# Patient Record
Sex: Female | Born: 1935 | Race: White | Hispanic: No | Smoking: Never smoker
Health system: Southern US, Community
[De-identification: ages and names within clinical notes are randomized; demographics above are authoritative.]

## PROBLEM LIST (undated history)

## (undated) DIAGNOSIS — J189 Pneumonia, unspecified organism: Secondary | ICD-10-CM

## (undated) DIAGNOSIS — E079 Disorder of thyroid, unspecified: Secondary | ICD-10-CM

## (undated) DIAGNOSIS — M722 Plantar fascial fibromatosis: Secondary | ICD-10-CM

## (undated) DIAGNOSIS — C801 Malignant (primary) neoplasm, unspecified: Secondary | ICD-10-CM

## (undated) DIAGNOSIS — E039 Hypothyroidism, unspecified: Secondary | ICD-10-CM

## (undated) DIAGNOSIS — H409 Unspecified glaucoma: Secondary | ICD-10-CM

## (undated) HISTORY — PX: APPENDECTOMY: SHX54

## (undated) HISTORY — PX: ABDOMINAL HYSTERECTOMY: SHX81

---
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
## 1999-10-14 ENCOUNTER — Encounter: Payer: Self-pay | Admitting: Family Medicine

## 1999-10-14 ENCOUNTER — Encounter: Admission: RE | Admit: 1999-10-14 | Discharge: 1999-10-14 | Payer: Self-pay | Admitting: Family Medicine

## 1999-10-17 ENCOUNTER — Encounter: Payer: Self-pay | Admitting: Family Medicine

## 1999-10-17 ENCOUNTER — Encounter: Admission: RE | Admit: 1999-10-17 | Discharge: 1999-10-17 | Payer: Self-pay | Admitting: Family Medicine

## 2001-01-07 ENCOUNTER — Encounter (INDEPENDENT_AMBULATORY_CARE_PROVIDER_SITE_OTHER): Payer: Self-pay | Admitting: Specialist

## 2001-01-07 ENCOUNTER — Other Ambulatory Visit: Admission: RE | Admit: 2001-01-07 | Discharge: 2001-01-07 | Payer: Self-pay | Admitting: Internal Medicine

## 2001-10-24 ENCOUNTER — Emergency Department (HOSPITAL_COMMUNITY): Admission: EM | Admit: 2001-10-24 | Discharge: 2001-10-24 | Payer: Self-pay

## 2003-08-02 ENCOUNTER — Other Ambulatory Visit: Admission: RE | Admit: 2003-08-02 | Discharge: 2003-08-02 | Payer: Self-pay | Admitting: Obstetrics and Gynecology

## 2003-09-26 ENCOUNTER — Encounter: Payer: Self-pay | Admitting: Cardiology

## 2003-09-26 ENCOUNTER — Ambulatory Visit (HOSPITAL_COMMUNITY): Admission: RE | Admit: 2003-09-26 | Discharge: 2003-09-26 | Payer: Self-pay | Admitting: Internal Medicine

## 2003-09-30 ENCOUNTER — Emergency Department (HOSPITAL_COMMUNITY): Admission: AD | Admit: 2003-09-30 | Discharge: 2003-09-30 | Payer: Self-pay | Admitting: Family Medicine

## 2004-11-28 ENCOUNTER — Emergency Department (HOSPITAL_COMMUNITY): Admission: EM | Admit: 2004-11-28 | Discharge: 2004-11-29 | Payer: Self-pay | Admitting: Emergency Medicine

## 2005-06-06 ENCOUNTER — Emergency Department (HOSPITAL_COMMUNITY): Admission: EM | Admit: 2005-06-06 | Discharge: 2005-06-06 | Payer: Self-pay | Admitting: Family Medicine

## 2007-04-24 ENCOUNTER — Emergency Department (HOSPITAL_COMMUNITY): Admission: EM | Admit: 2007-04-24 | Discharge: 2007-04-24 | Payer: Self-pay | Admitting: Family Medicine

## 2007-05-11 ENCOUNTER — Ambulatory Visit: Payer: Self-pay | Admitting: Internal Medicine

## 2007-10-14 DIAGNOSIS — IMO0001 Reserved for inherently not codable concepts without codable children: Secondary | ICD-10-CM | POA: Insufficient documentation

## 2007-10-14 DIAGNOSIS — M1990 Unspecified osteoarthritis, unspecified site: Secondary | ICD-10-CM | POA: Insufficient documentation

## 2007-10-14 DIAGNOSIS — Z8711 Personal history of peptic ulcer disease: Secondary | ICD-10-CM

## 2007-10-14 DIAGNOSIS — M2550 Pain in unspecified joint: Secondary | ICD-10-CM | POA: Insufficient documentation

## 2007-10-14 DIAGNOSIS — K219 Gastro-esophageal reflux disease without esophagitis: Secondary | ICD-10-CM | POA: Insufficient documentation

## 2007-10-14 DIAGNOSIS — K6389 Other specified diseases of intestine: Secondary | ICD-10-CM

## 2008-02-01 ENCOUNTER — Encounter: Admission: RE | Admit: 2008-02-01 | Discharge: 2008-02-28 | Payer: Self-pay | Admitting: Podiatry

## 2008-07-08 ENCOUNTER — Emergency Department (HOSPITAL_COMMUNITY): Admission: EM | Admit: 2008-07-08 | Discharge: 2008-07-08 | Payer: Self-pay | Admitting: Emergency Medicine

## 2009-03-07 ENCOUNTER — Ambulatory Visit: Payer: Self-pay | Admitting: Obstetrics and Gynecology

## 2009-03-07 ENCOUNTER — Encounter: Payer: Self-pay | Admitting: Obstetrics and Gynecology

## 2009-03-07 ENCOUNTER — Other Ambulatory Visit: Admission: RE | Admit: 2009-03-07 | Discharge: 2009-03-07 | Payer: Self-pay | Admitting: Obstetrics and Gynecology

## 2009-04-24 ENCOUNTER — Ambulatory Visit: Payer: Self-pay | Admitting: Sports Medicine

## 2009-04-24 DIAGNOSIS — Q667 Congenital pes cavus, unspecified foot: Secondary | ICD-10-CM

## 2009-04-24 DIAGNOSIS — R269 Unspecified abnormalities of gait and mobility: Secondary | ICD-10-CM

## 2009-05-22 ENCOUNTER — Ambulatory Visit: Payer: Self-pay | Admitting: Sports Medicine

## 2009-06-19 ENCOUNTER — Ambulatory Visit: Payer: Self-pay | Admitting: Sports Medicine

## 2009-06-19 DIAGNOSIS — M722 Plantar fascial fibromatosis: Secondary | ICD-10-CM

## 2009-11-22 ENCOUNTER — Ambulatory Visit: Payer: Self-pay | Admitting: Sports Medicine

## 2010-09-03 NOTE — Assessment & Plan Note (Signed)
Summary: FOOT PAIN/MJD   Vital Signs:  Patient profile:   76 year old female Height:      64 inches Weight:      186 pounds BP sitting:   137 / 72  Vitals Entered By: Lillia Pauls CMA (November 22, 2009 9:35 AM)  History of Present Illness: 76 yo F here for f/u plantar fasciitis  Was doing very well up until 2 weeks ago Issues only with right side currently Thinks this started when she increased from 20 reps of eccentrics to 30 reps. Pain at base of right heel and back of right heel (not at achilles though). Arch strap made this worse (metatarsalgia/mortons neuroma). Is icing, taking tylenol. Has insoles with scaphoid pads previously - now with hard arch support with spenco soft insole. Wears Z-coil shoes. Has not had an injection.   Physical Exam  General:  alert, well-developed, and well-nourished.   Msk:  Foot/Ankle: RIGHT No visible erythema or swelling. Small bunion. Range of motion is full in all directions. Normal 1st toe movement. Strength is 5/5 in all directions. Stable lateral and medial ligaments; squeeze test unremarkable; Mod TTP anterior calcaneus at plantar fascia insertion.  Also TTP posterior plantar heel.  Talar dome nontender; No pain at base of 5th MT; No tenderness over cuboid; No tenderness over N spot or navicular prominence No tenderness on posterior aspects of lateral and medial malleolus No sign of peroneal tendon subluxations; Able to walk 4 steps. Pes cavus. 2nd toes recruited by first toes. Hindfoot valgus.   Impression & Recommendations:  Problem # 1:  PLANTAR FASCIITIS, BILATERAL (ICD-728.71) Assessment Deteriorated Patient declined custom orthotics today stating the bases feel like they would be too hard.  Discussed cushioning we would place on bottoms of these and offered red cambray but still declined.  Continue icing, nsaids/tylenol as needed, decrease amount of eccentric exercise she is doing (this likely precipitated increase in  symptoms).  No arch strap as this is more painful.  No injection today - most of pain more posterior in heel than anterior portion of calcaneus - shot would likely provide little benefit.  Given rx for custom orthotics to [facility] to see if they have a kind she would find more comfortable.  F/u as needed.  Complete Medication List: 1)  Orthotics  Prescriptions: ORTHOTICS   #1 x 0   Entered by:   Lillia Pauls CMA   Authorized by:   Norton Blizzard MD   Signed by:   Lillia Pauls CMA on 11/22/2009   Method used:   Print then Give to Patient   RxID:   [id number] ORTHOTICS   #0 x 0   Entered by:   Lillia Pauls CMA   Authorized by:   Enid Baas MD   Signed by:   Lillia Pauls CMA on 11/22/2009   Method used:   Print then Give to Patient   RxID:   [id number]
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
## 2012-12-29 ENCOUNTER — Other Ambulatory Visit: Payer: Self-pay | Admitting: Family Medicine

## 2012-12-29 DIAGNOSIS — M549 Dorsalgia, unspecified: Secondary | ICD-10-CM

## 2013-01-04 ENCOUNTER — Ambulatory Visit
Admission: RE | Admit: 2013-01-04 | Discharge: 2013-01-04 | Disposition: A | Discharge status: Home / Self Care | Payer: Medicare Other | Source: Ambulatory Visit | Attending: Family Medicine | Admitting: Family Medicine

## 2013-01-04 DIAGNOSIS — M549 Dorsalgia, unspecified: Secondary | ICD-10-CM

## 2013-07-25 ENCOUNTER — Ambulatory Visit (INDEPENDENT_AMBULATORY_CARE_PROVIDER_SITE_OTHER): Payer: Medicare Other | Admitting: Sports Medicine

## 2013-07-25 ENCOUNTER — Encounter: Payer: Self-pay | Admitting: Sports Medicine

## 2013-07-25 VITALS — BP 114/65 | Ht 64.5 in | Wt 187.0 lb

## 2013-07-25 DIAGNOSIS — M545 Low back pain: Secondary | ICD-10-CM

## 2013-07-25 MED ORDER — TRAMADOL HCL 50 MG PO TBDP
50.0000 mg | ORAL_TABLET | Freq: Two times a day (BID) | ORAL | Status: DC | PRN
Start: 2013-07-25 — End: 2013-11-07
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
## 2013-07-25 NOTE — Patient Instructions (Addendum)
Continue pool exercises and stretching  On good days try to incorporate more walkin  Try taking tramadol twice daily as needed for pain  Please follow up in 3 months  Thank you for seeing [place] today!

## 2013-07-25 NOTE — Assessment & Plan Note (Signed)
She is strong and has good hip ROM  Advised that I think this is significant back issue with nerve root impingement  Trial on Tramadol bid  Keep up water exercise  Does not like to take medication but most issues are not ideal for rehab so feel we must push some medicine

## 2013-08-02 ENCOUNTER — Telehealth: Payer: Self-pay | Admitting: *Deleted

## 2013-08-02 MED ORDER — LIDOCAINE 5 % EX PTCH: 1.0000 | MEDICATED_PATCH | Freq: Every day | CUTANEOUS | Status: DC | PRN

## 2013-08-02 NOTE — Telephone Encounter (Signed)
Message copied by Mora Bellman on Tue Aug 02, 2013  9:13 AM ------      Message from: Enid Baas      Created: Mon Aug 01, 2013  8:04 PM      Regarding: FW: Lidocaine Patch instead of Tramadol      Contact: [phone]       She is welcome to try lidoderm patch although I do not know that she can get it approved since her problem is low back pain with probable radicular sxs.  I don't think it will be strong enough to help her pain.            Instructions are no more than 12 hours continuous.  Avoid skin, eye ittitation after handling.            KBF      ----- Message -----         From: Claiborne Billings         Sent: 08/01/2013   4:36 PM           To: Enid Baas, MD, Mora Bellman, RN      Subject: Lidocaine Patch instead of Tramadol                      Prefer not to take Tramadol.  Would like to get the Lidocaine patch instead.       ------

## 2013-08-10 ENCOUNTER — Encounter: Payer: Self-pay | Admitting: Internal Medicine

## 2013-09-09 ENCOUNTER — Other Ambulatory Visit: Payer: Self-pay | Admitting: Gastroenterology

## 2013-09-09 DIAGNOSIS — R1013 Epigastric pain: Secondary | ICD-10-CM

## 2013-09-26 ENCOUNTER — Ambulatory Visit (HOSPITAL_COMMUNITY)
Admission: RE | Admit: 2013-09-26 | Discharge: 2013-09-26 | Disposition: A | Discharge status: Home / Self Care | Payer: Medicare Other | Source: Ambulatory Visit | Attending: Gastroenterology | Admitting: Gastroenterology

## 2013-09-26 DIAGNOSIS — R1013 Epigastric pain: Secondary | ICD-10-CM

## 2013-09-26 MED ORDER — SINCALIDE 5 MCG IJ SOLR
0.0200 ug/kg | Freq: Once | INTRAMUSCULAR | Status: AC
  Administered 2013-09-26: 1.7 ug via INTRAVENOUS

## 2013-09-26 MED ORDER — TECHNETIUM TC 99M MEBROFENIN IV KIT
5.0000 | PACK | Freq: Once | INTRAVENOUS | Status: AC | PRN
  Administered 2013-09-26: 5 via INTRAVENOUS

## 2013-09-26 MED ORDER — SINCALIDE 5 MCG IJ SOLR
INTRAMUSCULAR | Status: AC
  Administered 2013-09-26: 1.7 ug via INTRAVENOUS
  Filled 2013-09-26: qty 5

## 2013-09-26 MED ORDER — STERILE WATER FOR INJECTION IJ SOLN
INTRAMUSCULAR | Status: AC
  Filled 2013-09-26: qty 10

## 2013-11-07 ENCOUNTER — Ambulatory Visit (INDEPENDENT_AMBULATORY_CARE_PROVIDER_SITE_OTHER): Payer: Medicare Other | Admitting: General Surgery

## 2013-11-07 ENCOUNTER — Encounter (INDEPENDENT_AMBULATORY_CARE_PROVIDER_SITE_OTHER): Payer: Self-pay | Admitting: General Surgery

## 2013-11-07 VITALS — BP 102/68 | HR 80 | Temp 97.3°F | Resp 14 | Ht 64.5 in | Wt 186.8 lb

## 2013-11-07 DIAGNOSIS — R109 Unspecified abdominal pain: Secondary | ICD-10-CM | POA: Insufficient documentation

## 2013-11-07 NOTE — Patient Instructions (Signed)
Follow up with a primary medical doctor

## 2013-11-07 NOTE — Progress Notes (Signed)
Patient ID: Emma Griffith, female   DOB: 1935-05-26, 79 y.o.   MRN: [id number]  Chief Complaint  Patient presents with  . New Evaluation    eval abd pain/nausea/RUQ pain    HPI Emma Griffith is a 79 y.o. female.  We are asked to see the patient in consultation by Dr. Collene Mares to evaluate her for abdominal pain. The patient is a 79 year old white female who experienced an episode of epigastric pain in early February. She described a pain as a bloated feeling which was then followed by 2 episodes of explosive diarrhea. She has not had the pain since. She underwent an ultrasound which showed only a small cyst on her kidney and liver With a normal gallbladder. She also underwent a HIDA scan that showed normal emptying and a normal ejection fraction of the gallbladder. HPI  History reviewed. No pertinent past medical history.  Past Surgical History  Procedure Laterality Date  . Abdominal hysterectomy      1986  . Appendectomy      1948    History reviewed. No pertinent family history.  Social History History  Substance Use Topics  . Smoking status: Never Smoker   . Smokeless tobacco: Not on file  . Alcohol Use: No    Allergies  Allergen Reactions  . Asa [Aspirin]   . Caffeine     Current Outpatient Prescriptions  Medication Sig Dispense Refill  . CHOLINE PO Take by mouth.      . lidocaine (LIDODERM) 5 % Place 1 patch onto the skin daily as needed. Remove & Discard patch within 12 hours or as directed by MD  30 patch  0  . Ox Bile Extract POWD by Does not apply route.      . thyroid (ARMOUR) 60 MG tablet Take 60 mg by mouth daily before breakfast.       No current facility-administered medications for this visit.    Review of Systems Review of Systems  Constitutional: Negative.   HENT: Negative.   Eyes: Negative.   Respiratory: Negative.   Cardiovascular: Negative.   Gastrointestinal: Negative.   Endocrine: Negative.   Genitourinary: Negative.   Musculoskeletal:  Negative.   Skin: Negative.   Allergic/Immunologic: Negative.   Neurological: Negative.   Hematological: Negative.   Psychiatric/Behavioral: Negative.     Blood pressure 102/68, pulse 80, temperature 97.3 F (36.3 C), temperature source Temporal, resp. rate 14, height 5' 4.5" (1.638 m), weight 186 lb 12.8 oz (84.732 kg).  Physical Exam Physical Exam  Constitutional: She is oriented to person, place, and time. She appears well-developed and well-nourished.  HENT:  Head: Normocephalic and atraumatic.  Eyes: Conjunctivae and EOM are normal. Pupils are equal, round, and reactive to light.  Neck: Normal range of motion. Neck supple.  Cardiovascular: Normal rate, regular rhythm and normal heart sounds.   Pulmonary/Chest: Effort normal and breath sounds normal.  Abdominal: Soft. Bowel sounds are normal.  Musculoskeletal: Normal range of motion.  Lymphadenopathy:    She has no cervical adenopathy.  Neurological: She is alert and oriented to person, place, and time.  Skin: Skin is warm and dry.  Psychiatric: She has a normal mood and affect. Her behavior is normal.    Data Reviewed As above  Assessment    The patient had one episode of epigastric pain back in February and has had a normal gallbladder workup.     Plan    At this point I would not recommend having her gallbladder removed since  we do not have a good indication for it. I have discussed this in detail with her and she is in agreement. She does need to establish herself with a primary medical doctor that can follow up on this issue should it recur.        TOTH III,Severo Beber S 11/07/2013, 2:22 PM

## 2014-01-30 ENCOUNTER — Encounter: Payer: Self-pay | Admitting: Podiatry

## 2014-01-30 ENCOUNTER — Ambulatory Visit (INDEPENDENT_AMBULATORY_CARE_PROVIDER_SITE_OTHER): Payer: Medicare Other | Admitting: Podiatry

## 2014-01-30 VITALS — BP 107/70 | HR 73 | Resp 14 | Ht 64.5 in | Wt 183.0 lb

## 2014-01-30 DIAGNOSIS — L600 Ingrowing nail: Secondary | ICD-10-CM

## 2014-01-30 NOTE — Progress Notes (Signed)
   Subjective:    Patient ID: Emma Griffith, female    DOB: Mar 18, 1935, 79 y.o.   MRN: [id number]  HPI Comments: N toenails problemss L right 2,3 rd toenails D and O 2 months C splitting A none T none  The patient describes hitting second right toe against a rocking chair resulting in a "crack toenail ".   Review of Systems  Constitutional: Positive for diaphoresis.  HENT: Positive for congestion, hearing loss and tinnitus.   Gastrointestinal: Positive for abdominal pain, constipation and abdominal distention.  Musculoskeletal: Positive for back pain, gait problem and myalgias.  Allergic/Immunologic: Positive for food allergies.       Gluten, dairy, banana, coffee, wine, and alcoholic beverage  All other systems reviewed and are negative.      Objective:   Physical Exam  Orientated x3 white female  Vascular: DP and PT pulses 2/4 bilaterally  Neurological: Sensation to 10 g monofilament wire intact 5/5 right and 4/5 left Ankle reflexes equal and reactive bilaterally Vibratory sensation intact bilaterally  Dermatological: The medial margin of the second right toe nail has a fissure with mobility. There is no erythema, edema or drainage surrounding the fissure The left hallux nail   is hypertrophic with texture and color changes  Musculoskeletal: Left HAV deformity No restriction ankle, midtarsal, subtalar joints bilaterally   Assessment & Plan:   Assessment: Satisfactory neurovascular status Traumatic fissure medial border second right toenail without any sign of infection Onychomycoses left hallux  Plan: The medial border of the left hallux nail was debrided without any bleeding. No further treatment is needed at this time for the above problem.  Patient had questions about treatment of onychomycoses concerning left hallux toenail. Discussed treatment options for onychomycoses including no treatment, topical, oral and permanent nail surgery. Patient is  interested in continuing her own topical treatment of over-the-counter medication  Reappoint at patient's request

## 2014-02-16 ENCOUNTER — Encounter: Payer: Self-pay | Admitting: Internal Medicine

## 2014-09-07 HISTORY — PX: EYE SURGERY: SHX253

## 2014-10-16 HISTORY — PX: EYE SURGERY: SHX253

## 2015-01-16 ENCOUNTER — Ambulatory Visit (INDEPENDENT_AMBULATORY_CARE_PROVIDER_SITE_OTHER): Payer: Medicare Other

## 2015-01-16 ENCOUNTER — Ambulatory Visit (INDEPENDENT_AMBULATORY_CARE_PROVIDER_SITE_OTHER): Payer: Medicare Other | Admitting: Podiatry

## 2015-01-16 ENCOUNTER — Encounter: Payer: Self-pay | Admitting: Podiatry

## 2015-01-16 ENCOUNTER — Ambulatory Visit: Payer: Medicare Other

## 2015-01-16 VITALS — BP 109/65 | HR 60 | Temp 99.4°F | Resp 14

## 2015-01-16 DIAGNOSIS — M722 Plantar fascial fibromatosis: Secondary | ICD-10-CM

## 2015-01-16 DIAGNOSIS — R52 Pain, unspecified: Secondary | ICD-10-CM

## 2015-01-16 NOTE — Patient Instructions (Signed)
As per your request we are referring you to physical therapy for treatment of bilateral fasciitis\  Plantar Fasciitis Plantar fasciitis is a common condition that causes foot pain. It is soreness (inflammation) of the band of tough fibrous tissue on the bottom of the foot that runs from the heel bone (calcaneus) to the ball of the foot. The cause of this soreness may be from excessive standing, poor fitting shoes, running on hard surfaces, being overweight, having an abnormal walk, or overuse (this is common in runners) of the painful foot or feet. It is also common in aerobic exercise dancers and ballet dancers. SYMPTOMS  Most people with plantar fasciitis complain of:  Severe pain in the morning on the bottom of their foot especially when taking the first steps out of bed. This pain recedes after a few minutes of walking.  Severe pain is experienced also during walking following a long period of inactivity.  Pain is worse when walking barefoot or up stairs DIAGNOSIS   Your caregiver will diagnose this condition by examining and feeling your foot.  Special tests such as X-rays of your foot, are usually not needed. PREVENTION   Consult a sports medicine professional before beginning a new exercise program.  Walking programs offer a good workout. With walking there is a lower chance of overuse injuries common to runners. There is less impact and less jarring of the joints.  Begin all new exercise programs slowly. If problems or pain develop, decrease the amount of time or distance until you are at a comfortable level.  Wear good shoes and replace them regularly.  Stretch your foot and the heel cords at the back of the ankle (Achilles tendon) both before and after exercise.  Run or exercise on even surfaces that are not hard. For example, asphalt is better than pavement.  Do not run barefoot on hard surfaces.  If using a treadmill, vary the incline.  Do not continue to workout if you  have foot or joint problems. Seek professional help if they do not improve. HOME CARE INSTRUCTIONS   Avoid activities that cause you pain until you recover.  Use ice or cold packs on the problem or painful areas after working out.  Only take over-the-counter or prescription medicines for pain, discomfort, or fever as directed by your caregiver.  Soft shoe inserts or athletic shoes with air or gel sole cushions may be helpful.  If problems continue or become more severe, consult a sports medicine caregiver or your own health care provider. Cortisone is a potent anti-inflammatory medication that may be injected into the painful area. You can discuss this treatment with your caregiver. MAKE SURE YOU:   Understand these instructions.  Will watch your condition.  Will get help right away if you are not doing well or get worse. Document Released: 04/15/2001 Document Revised: 10/13/2011 Document Reviewed: 06/14/2008 [facility] Patient Information 2015 [place], [place]. This information is not intended to replace advice given to you by your health care provider. Make sure you discuss any questions you have with your health care provider.

## 2015-01-16 NOTE — Progress Notes (Signed)
   Subjective:    Patient ID: Emma Griffith, female    DOB: 1935/07/18, 80 y.o.   MRN: [id number]  HPI  N- pain L- B/L arch foot  D-5-6 wks,had before 2-3 years ago and cleared up O-intermittent C-sharpe A-being on her feet T- she has been wearing compression socks and brace at night. It seems to help a little.  Patient presents here today with B/L foot arch pain with the the right foot hurting the worse. More recently patient has been doing active gardening work when she became aware of arch pain.  She describes a previous history of arch pain and visiting multiple doctors. She describes physical therapy, cast immobilization, cortisone injection which he said exacerbated his symptoms. She also describes oral prednisone she said at that time reduced the symptoms.   Review of Systems  Gastrointestinal:       Stomach pain and swelling       Objective:   Physical Exam  Orientated 3  Vascular: DP and PT pulses 2/4 bilaterally Capillary reflex immediate bilaterally No peripheral edema noted bilaterally  Neurological: Ankle reflexes equal and reactive bilaterally Vibratory sensation intact bilaterally Sensation to 10 g monofilament wire intact 5/5 bilaterally  Dermatological: Texture and turgor within normal limits No skin lesions noted bilaterally  Musculoskeletal: Patient is limping gait   palpable tenderness in medial plantar right and left fascial areas and mid arch without any palpable lesions right greater than left  X-ray examination today demonstrates well-organized inferior calcaneal spurs       Assessment & Plan:   Assessment: Bilateral fasciitis right greater than left  Plan: Reviewed results of examination x-ray with patient today. I offered patient cortisone injection which she declined. I offered oral prednisone which she declined. She requested referral to physical therapy today and I referred her for physical therapy as indicated for treatment  of bilateral fasciitis 3 weeks  Patient advised to return if the physical therapy does not resolve her symptoms adequately

## 2015-01-18 ENCOUNTER — Other Ambulatory Visit: Payer: Self-pay | Admitting: Surgery

## 2015-01-18 DIAGNOSIS — R935 Abnormal findings on diagnostic imaging of other abdominal regions, including retroperitoneum: Secondary | ICD-10-CM

## 2015-01-25 ENCOUNTER — Ambulatory Visit
Admission: RE | Admit: 2015-01-25 | Discharge: 2015-01-25 | Disposition: A | Discharge status: Home / Self Care | Payer: Medicare Other | Source: Ambulatory Visit | Attending: Surgery | Admitting: Surgery

## 2015-01-25 DIAGNOSIS — R935 Abnormal findings on diagnostic imaging of other abdominal regions, including retroperitoneum: Secondary | ICD-10-CM

## 2015-01-29 ENCOUNTER — Other Ambulatory Visit: Payer: Self-pay

## 2016-01-17 ENCOUNTER — Other Ambulatory Visit: Payer: Self-pay | Admitting: Gastroenterology

## 2016-01-17 DIAGNOSIS — R1011 Right upper quadrant pain: Secondary | ICD-10-CM

## 2016-01-17 DIAGNOSIS — R110 Nausea: Secondary | ICD-10-CM

## 2016-01-23 ENCOUNTER — Other Ambulatory Visit: Payer: Self-pay | Admitting: Family Medicine

## 2016-01-23 DIAGNOSIS — R413 Other amnesia: Secondary | ICD-10-CM

## 2016-01-23 DIAGNOSIS — R410 Disorientation, unspecified: Secondary | ICD-10-CM

## 2016-01-24 ENCOUNTER — Other Ambulatory Visit: Payer: Medicare Other

## 2016-01-24 ENCOUNTER — Ambulatory Visit
Admission: RE | Admit: 2016-01-24 | Discharge: 2016-01-24 | Disposition: A | Discharge status: Home / Self Care | Payer: Medicare Other | Source: Ambulatory Visit | Attending: Family Medicine | Admitting: Family Medicine

## 2016-01-24 DIAGNOSIS — R410 Disorientation, unspecified: Secondary | ICD-10-CM

## 2016-01-24 DIAGNOSIS — R413 Other amnesia: Secondary | ICD-10-CM

## 2016-01-24 MED ORDER — GADOBENATE DIMEGLUMINE 529 MG/ML IV SOLN
17.0000 mL | Freq: Once | INTRAVENOUS | Status: AC | PRN
  Administered 2016-01-24: 17 mL via INTRAVENOUS

## 2016-01-28 ENCOUNTER — Ambulatory Visit (HOSPITAL_COMMUNITY)
Admission: RE | Admit: 2016-01-28 | Discharge: 2016-01-28 | Disposition: A | Discharge status: Home / Self Care | Payer: Medicare Other | Source: Ambulatory Visit | Attending: Gastroenterology | Admitting: Gastroenterology

## 2016-01-28 DIAGNOSIS — R110 Nausea: Secondary | ICD-10-CM | POA: Insufficient documentation

## 2016-01-28 DIAGNOSIS — N281 Cyst of kidney, acquired: Secondary | ICD-10-CM | POA: Insufficient documentation

## 2016-01-28 DIAGNOSIS — R1011 Right upper quadrant pain: Secondary | ICD-10-CM | POA: Insufficient documentation

## 2016-01-28 DIAGNOSIS — K7689 Other specified diseases of liver: Secondary | ICD-10-CM | POA: Diagnosis not present

## 2016-01-28 MED ORDER — TECHNETIUM TC 99M MEBROFENIN IV KIT
5.1000 | PACK | Freq: Once | INTRAVENOUS | Status: AC | PRN
  Administered 2016-01-28: 5 via INTRAVENOUS

## 2016-02-02 DIAGNOSIS — J189 Pneumonia, unspecified organism: Secondary | ICD-10-CM

## 2016-02-02 HISTORY — DX: Pneumonia, unspecified organism: J18.9

## 2016-02-15 DIAGNOSIS — C710 Malignant neoplasm of cerebrum, except lobes and ventricles: Secondary | ICD-10-CM | POA: Insufficient documentation

## 2016-02-18 ENCOUNTER — Ambulatory Visit: Payer: Medicare Other | Admitting: Neurology

## 2016-02-20 ENCOUNTER — Emergency Department (HOSPITAL_COMMUNITY): Payer: Medicare Other

## 2016-02-20 ENCOUNTER — Encounter (HOSPITAL_COMMUNITY): Payer: Self-pay | Admitting: Emergency Medicine

## 2016-02-20 ENCOUNTER — Inpatient Hospital Stay (HOSPITAL_COMMUNITY)
Admission: EM | Admit: 2016-02-20 | Discharge: 2016-02-23 | DRG: 193 | Disposition: A | Discharge status: Skilled Nursing Facility | Payer: Medicare Other | Attending: Internal Medicine | Admitting: Internal Medicine

## 2016-02-20 DIAGNOSIS — Z66 Do not resuscitate: Secondary | ICD-10-CM | POA: Diagnosis present

## 2016-02-20 DIAGNOSIS — C719 Malignant neoplasm of brain, unspecified: Secondary | ICD-10-CM | POA: Diagnosis present

## 2016-02-20 DIAGNOSIS — J189 Pneumonia, unspecified organism: Principal | ICD-10-CM | POA: Diagnosis present

## 2016-02-20 DIAGNOSIS — R7989 Other specified abnormal findings of blood chemistry: Secondary | ICD-10-CM

## 2016-02-20 DIAGNOSIS — H409 Unspecified glaucoma: Secondary | ICD-10-CM | POA: Diagnosis present

## 2016-02-20 DIAGNOSIS — R269 Unspecified abnormalities of gait and mobility: Secondary | ICD-10-CM

## 2016-02-20 DIAGNOSIS — Z91018 Allergy to other foods: Secondary | ICD-10-CM

## 2016-02-20 DIAGNOSIS — Z888 Allergy status to other drugs, medicaments and biological substances status: Secondary | ICD-10-CM

## 2016-02-20 DIAGNOSIS — Z79899 Other long term (current) drug therapy: Secondary | ICD-10-CM

## 2016-02-20 DIAGNOSIS — Z791 Long term (current) use of non-steroidal anti-inflammatories (NSAID): Secondary | ICD-10-CM

## 2016-02-20 DIAGNOSIS — J181 Lobar pneumonia, unspecified organism: Secondary | ICD-10-CM

## 2016-02-20 DIAGNOSIS — E039 Hypothyroidism, unspecified: Secondary | ICD-10-CM | POA: Diagnosis present

## 2016-02-20 DIAGNOSIS — R778 Other specified abnormalities of plasma proteins: Secondary | ICD-10-CM

## 2016-02-20 DIAGNOSIS — J9601 Acute respiratory failure with hypoxia: Secondary | ICD-10-CM | POA: Diagnosis present

## 2016-02-20 DIAGNOSIS — R748 Abnormal levels of other serum enzymes: Secondary | ICD-10-CM | POA: Diagnosis present

## 2016-02-20 HISTORY — DX: Malignant (primary) neoplasm, unspecified: C80.1

## 2016-02-20 HISTORY — DX: Pneumonia, unspecified organism: J18.9

## 2016-02-20 HISTORY — DX: Plantar fascial fibromatosis: M72.2

## 2016-02-20 HISTORY — DX: Unspecified glaucoma: H40.9

## 2016-02-20 HISTORY — DX: Disorder of thyroid, unspecified: E07.9

## 2016-02-20 HISTORY — DX: Hypothyroidism, unspecified: E03.9

## 2016-02-20 LAB — URINALYSIS, ROUTINE W REFLEX MICROSCOPIC
Bilirubin Urine: NEGATIVE
GLUCOSE, UA: NEGATIVE mg/dL
Ketones, ur: 15 mg/dL — AB
Nitrite: NEGATIVE
PH: 6 (ref 5.0–8.0)
Protein, ur: NEGATIVE mg/dL
SPECIFIC GRAVITY, URINE: 1.013 (ref 1.005–1.030)

## 2016-02-20 LAB — CBC WITH DIFFERENTIAL/PLATELET
Basophils Absolute: 0 10*3/uL (ref 0.0–0.1)
Basophils Relative: 0 %
Eosinophils Absolute: 0 10*3/uL (ref 0.0–0.7)
Eosinophils Relative: 0 %
HCT: 40 % (ref 36.0–46.0)
HEMOGLOBIN: 13.5 g/dL (ref 12.0–15.0)
LYMPHS ABS: 1.3 10*3/uL (ref 0.7–4.0)
LYMPHS PCT: 10 %
MCH: 30.7 pg (ref 26.0–34.0)
MCHC: 33.8 g/dL (ref 30.0–36.0)
MCV: 90.9 fL (ref 78.0–100.0)
MONOS PCT: 7 %
Monocytes Absolute: 0.9 10*3/uL (ref 0.1–1.0)
NEUTROS PCT: 83 %
Neutro Abs: 11 10*3/uL — ABNORMAL HIGH (ref 1.7–7.7)
Platelets: 188 10*3/uL (ref 150–400)
RBC: 4.4 MIL/uL (ref 3.87–5.11)
RDW: 13.9 % (ref 11.5–15.5)
WBC: 13.2 10*3/uL — AB (ref 4.0–10.5)

## 2016-02-20 LAB — COMPREHENSIVE METABOLIC PANEL
ALBUMIN: 2.7 g/dL — AB (ref 3.5–5.0)
ALT: 44 U/L (ref 14–54)
AST: 21 U/L (ref 15–41)
Alkaline Phosphatase: 66 U/L (ref 38–126)
Anion gap: 8 (ref 5–15)
BILIRUBIN TOTAL: 0.7 mg/dL (ref 0.3–1.2)
BUN: 12 mg/dL (ref 6–20)
CHLORIDE: 109 mmol/L (ref 101–111)
CO2: 22 mmol/L (ref 22–32)
CREATININE: 0.82 mg/dL (ref 0.44–1.00)
Calcium: 8.6 mg/dL — ABNORMAL LOW (ref 8.9–10.3)
GFR calc Af Amer: >60 mL/min (ref >60)
GLUCOSE: 105 mg/dL — AB (ref 65–99)
Potassium: 3.9 mmol/L (ref 3.5–5.1)
Sodium: 139 mmol/L (ref 135–145)
TOTAL PROTEIN: 6.1 g/dL — AB (ref 6.5–8.1)

## 2016-02-20 LAB — I-STAT ARTERIAL BLOOD GAS, ED
Acid-base deficit: 1 mmol/L (ref 0.0–2.0)
BICARBONATE: 21.7 meq/L (ref 20.0–24.0)
O2 Saturation: 91 %
PCO2 ART: 31.2 mmHg — AB (ref 35.0–45.0)
PO2 ART: 63 mmHg — AB (ref 80.0–100.0)
Patient temperature: 101.4
TCO2: 23 mmol/L (ref 0–100)
pH, Arterial: 7.455 — ABNORMAL HIGH (ref 7.350–7.450)

## 2016-02-20 LAB — I-STAT CG4 LACTIC ACID, ED
LACTIC ACID, VENOUS: 1.3 mmol/L (ref 0.5–1.9)
Lactic Acid, Venous: 0.85 mmol/L (ref 0.5–1.9)

## 2016-02-20 LAB — TROPONIN I: Troponin I: 0.09 ng/mL (ref <0.03)

## 2016-02-20 LAB — URINE MICROSCOPIC-ADD ON

## 2016-02-20 MED ORDER — DEXTROSE 5 % IV SOLN: 500.0000 mg | INTRAVENOUS | Status: DC

## 2016-02-20 MED ORDER — MORPHINE SULFATE (PF) 2 MG/ML IV SOLN: 1.0000 mg | INTRAVENOUS | Status: DC | PRN

## 2016-02-20 MED ORDER — SODIUM CHLORIDE 0.9 % IV BOLUS (SEPSIS)
1000.0000 mL | Freq: Once | INTRAVENOUS | Status: AC
  Administered 2016-02-20: 1000 mL via INTRAVENOUS

## 2016-02-20 MED ORDER — OXYCODONE-ACETAMINOPHEN 5-325 MG PO TABS
1.0000 | ORAL_TABLET | ORAL | Status: DC | PRN
  Administered 2016-02-20: 1 via ORAL
  Administered 2016-02-21 (×4): 2 via ORAL
  Filled 2016-02-20: qty 1
  Filled 2016-02-20 (×5): qty 2

## 2016-02-20 MED ORDER — ACETAMINOPHEN 325 MG PO TABS: 325.0000 mg | ORAL_TABLET | ORAL | Status: DC | PRN

## 2016-02-20 MED ORDER — IPRATROPIUM-ALBUTEROL 0.5-2.5 (3) MG/3ML IN SOLN: 3.0000 mL | Freq: Four times a day (QID) | RESPIRATORY_TRACT | Status: DC | PRN

## 2016-02-20 MED ORDER — ACETAMINOPHEN 500 MG PO TABS
1000.0000 mg | ORAL_TABLET | Freq: Once | ORAL | Status: AC
  Administered 2016-02-20: 1000 mg via ORAL
  Filled 2016-02-20: qty 2

## 2016-02-20 MED ORDER — SODIUM CHLORIDE 0.9 % IV SOLN
INTRAVENOUS | Status: DC
  Administered 2016-02-20 – 2016-02-22 (×2): via INTRAVENOUS

## 2016-02-20 MED ORDER — GUAIFENESIN ER 600 MG PO TB12
600.0000 mg | ORAL_TABLET | Freq: Two times a day (BID) | ORAL | Status: DC
  Administered 2016-02-20 – 2016-02-23 (×6): 600 mg via ORAL
  Filled 2016-02-20 (×6): qty 1

## 2016-02-20 MED ORDER — DEXTROSE 5 % IV SOLN
1.0000 g | Freq: Once | INTRAVENOUS | Status: AC
  Administered 2016-02-20: 1 g via INTRAVENOUS
  Filled 2016-02-20: qty 10

## 2016-02-20 MED ORDER — DEXTROSE 5 % IV SOLN
1.0000 g | INTRAVENOUS | Status: DC
  Administered 2016-02-21 – 2016-02-22 (×2): 1 g via INTRAVENOUS
  Filled 2016-02-20 (×3): qty 10

## 2016-02-20 MED ORDER — ENOXAPARIN SODIUM 40 MG/0.4ML ~~LOC~~ SOLN: 40.0000 mg | SUBCUTANEOUS | Status: DC

## 2016-02-20 MED ORDER — AZITHROMYCIN 500 MG IV SOLR
500.0000 mg | Freq: Once | INTRAVENOUS | Status: AC
  Administered 2016-02-20: 500 mg via INTRAVENOUS
  Filled 2016-02-20: qty 500

## 2016-02-20 MED ORDER — DEXTROSE 5 % IV SOLN: 1.0000 g | INTRAVENOUS | Status: DC
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
## 2016-02-20 NOTE — Progress Notes (Signed)
ABG obtained on patient on nasal cannula.  Results given to MD.  No further instructions at this time.  Will continue to monitor.    Ref. Range 02/20/2016 11:33  Sample type Unknown ARTERIAL  pH, Arterial Latest Ref Range: 7.350-7.450  7.455 (H)  pCO2 arterial Latest Ref Range: 35.0-45.0 mmHg 31.2 (L)  pO2, Arterial Latest Ref Range: 80.0-100.0 mmHg 63.0 (L)  Bicarbonate Latest Ref Range: 20.0-24.0 mEq/L 21.7  TCO2 Latest Ref Range: 0-100 mmol/L 23  Acid-base deficit Latest Ref Range: 0.0-2.0 mmol/L 1.0  O2 Saturation Latest Units: % 91.0  Patient temperature Unknown 101.4 F  Collection site Unknown RADIAL, ALLEN'S T.Marland KitchenMarland Kitchen

## 2016-02-20 NOTE — ED Notes (Signed)
Pt arrives from home via [facility] c/o upper back pain x 2 days.  EMS reports giving 100 mcg Fentanyl.  EMS reports recent dx of brain CA. Per EMS pt at baseline. AOx4

## 2016-02-20 NOTE — ED Notes (Signed)
Pt able to ambulate to bathroom with assistance.  Pt requesting to take her digestive enzymes and spray her back with "cold spray".  Pt reports back spasms.

## 2016-02-20 NOTE — ED Notes (Signed)
MD at bedside. 

## 2016-02-21 ENCOUNTER — Encounter (HOSPITAL_COMMUNITY): Payer: Self-pay | Admitting: General Practice

## 2016-02-21 ENCOUNTER — Observation Stay (HOSPITAL_COMMUNITY): Payer: Medicare Other

## 2016-02-21 DIAGNOSIS — R7989 Other specified abnormal findings of blood chemistry: Secondary | ICD-10-CM

## 2016-02-21 DIAGNOSIS — Z515 Encounter for palliative care: Secondary | ICD-10-CM

## 2016-02-21 DIAGNOSIS — Z7189 Other specified counseling: Secondary | ICD-10-CM | POA: Diagnosis not present

## 2016-02-21 DIAGNOSIS — J189 Pneumonia, unspecified organism: Secondary | ICD-10-CM | POA: Diagnosis not present

## 2016-02-21 DIAGNOSIS — C719 Malignant neoplasm of brain, unspecified: Secondary | ICD-10-CM

## 2016-02-21 LAB — CBC
HEMATOCRIT: 37.7 % (ref 36.0–46.0)
HEMOGLOBIN: 12.5 g/dL (ref 12.0–15.0)
MCH: 30.2 pg (ref 26.0–34.0)
MCHC: 33.2 g/dL (ref 30.0–36.0)
MCV: 91.1 fL (ref 78.0–100.0)
Platelets: 198 10*3/uL (ref 150–400)
RBC: 4.14 MIL/uL (ref 3.87–5.11)
RDW: 14.2 % (ref 11.5–15.5)
WBC: 15 10*3/uL — ABNORMAL HIGH (ref 4.0–10.5)

## 2016-02-21 LAB — COMPREHENSIVE METABOLIC PANEL
ALBUMIN: 2.4 g/dL — AB (ref 3.5–5.0)
ALT: 30 U/L (ref 14–54)
ANION GAP: 5 (ref 5–15)
AST: 18 U/L (ref 15–41)
Alkaline Phosphatase: 62 U/L (ref 38–126)
BILIRUBIN TOTAL: 0.7 mg/dL (ref 0.3–1.2)
BUN: 14 mg/dL (ref 6–20)
CHLORIDE: 109 mmol/L (ref 101–111)
CO2: 24 mmol/L (ref 22–32)
Calcium: 8.5 mg/dL — ABNORMAL LOW (ref 8.9–10.3)
Creatinine, Ser: 0.94 mg/dL (ref 0.44–1.00)
GFR calc Af Amer: >60 mL/min (ref >60)
GFR calc non Af Amer: 57 mL/min — ABNORMAL LOW (ref >60)
GLUCOSE: 96 mg/dL (ref 65–99)
POTASSIUM: 4.4 mmol/L (ref 3.5–5.1)
SODIUM: 138 mmol/L (ref 135–145)
Total Protein: 6 g/dL — ABNORMAL LOW (ref 6.5–8.1)

## 2016-02-21 LAB — TROPONIN I
TROPONIN I: 0.06 ng/mL — AB (ref <0.03)
Troponin I: 0.06 ng/mL (ref <0.03)

## 2016-02-21 LAB — URINE CULTURE

## 2016-02-21 MED ORDER — DEXAMETHASONE SODIUM PHOSPHATE 4 MG/ML IJ SOLN
2.0000 mg | Freq: Two times a day (BID) | INTRAMUSCULAR | Status: DC
  Administered 2016-02-21 – 2016-02-23 (×5): 2 mg via INTRAVENOUS
  Filled 2016-02-21 (×6): qty 0.5

## 2016-02-21 MED ORDER — POLYETHYLENE GLYCOL 3350 17 G PO PACK
34.0000 g | PACK | Freq: Two times a day (BID) | ORAL | Status: AC
  Administered 2016-02-21 – 2016-02-22 (×4): 34 g via ORAL
  Filled 2016-02-21 (×4): qty 2

## 2016-02-21 MED ORDER — SENNOSIDES-DOCUSATE SODIUM 8.6-50 MG PO TABS
1.0000 | ORAL_TABLET | Freq: Two times a day (BID) | ORAL | Status: DC
  Administered 2016-02-21 – 2016-02-23 (×5): 1 via ORAL
  Filled 2016-02-21 (×5): qty 1

## 2016-02-21 MED ORDER — ENOXAPARIN SODIUM 40 MG/0.4ML ~~LOC~~ SOLN
40.0000 mg | SUBCUTANEOUS | Status: DC
  Administered 2016-02-21 – 2016-02-23 (×3): 40 mg via SUBCUTANEOUS
  Filled 2016-02-21 (×3): qty 0.4

## 2016-02-21 MED ORDER — DEXTROSE 5 % IV SOLN
500.0000 mg | INTRAVENOUS | Status: DC
  Administered 2016-02-21 – 2016-02-22 (×2): 500 mg via INTRAVENOUS
  Filled 2016-02-21 (×3): qty 500

## 2016-02-21 MED ORDER — BISACODYL 5 MG PO TBEC
10.0000 mg | DELAYED_RELEASE_TABLET | Freq: Once | ORAL | Status: AC
  Administered 2016-02-21: 10 mg via ORAL
  Filled 2016-02-21: qty 2

## 2016-02-21 MED ORDER — ZOLPIDEM TARTRATE 5 MG PO TABS
5.0000 mg | ORAL_TABLET | Freq: Once | ORAL | Status: AC
  Administered 2016-02-21: 5 mg via ORAL
  Filled 2016-02-21: qty 1

## 2016-02-21 NOTE — Progress Notes (Signed)
PROGRESS NOTE                                                                                                                                                                                                             Patient Demographics:    Emma Griffith, is a 81 y.o. female, DOB - 11-07-1934, [id number]  Admit date - 02/20/2016   Admitting Physician Waldemar Dickens, MD  Outpatient Primary MD for the patient is [name], MD  LOS -   Chief Complaint  Patient presents with  . Back Pain       Brief Narrative   81 y.o. female with a Past Medical History of GBM, glaucoma, thyroid dysfunction who presents with acute respiratory failure likely secondary to CAP. Elevated troponin likely from demand   Subjective:    Emma Griffith today has, No headache, No chest pain, No abdominal pain - No Nausea,    Assessment  & Plan :    Active Problems:   GAIT DISTURBANCE   GBM (glioblastoma multiforme) (HCC)   CAP (community acquired pneumonia)   Glaucoma   Elevated troponin  CAP - Chest chest x-ray with evidence of left lower lobe airspace pneumonia, presents with fever 101.4, will leukocytosis 13.2 - Continue with IV Rocephin and azithromycin - Follow sputum cultures, Legionella , strep pneumonia antigen.  Hypothyroidism: - Continue home Synthroid  Elevated Troponin 0.09 on arrival.  - No prior cardiac history The patient is asymptomatic. May be related to oxygen demand - Cycle cardiac enzymes, follow on 2-D echo  New diagnosis of Glioblastoma,  - per biopsy at [facility] 01/2016, Dr. [name] - Further management at [facility] consult appreciated, started on low-dose Decadron.  Glaucoma - Continue home meds     Code Status : DNR  Family Communication  : None at bedside  Disposition Plan  : pending PT consult  Consults  :  palliative  Procedures  : none  DVT Prophylaxis  :  Lovenox   Lab Results  Component  Value Date   PLT 198 02/21/2016    Antibiotics  :    Anti-infectives    Start     Dose/Rate Route Frequency Ordered Stop   02/21/16 1200  cefTRIAXone (ROCEPHIN) 1 g in dextrose 5 % 50 mL IVPB     1 g 100 mL/hr  over 30 Minutes Intravenous Every 24 hours 02/20/16 1613 02/27/16 1159   02/20/16 1430  cefTRIAXone (ROCEPHIN) 1 g in dextrose 5 % 50 mL IVPB  Status:  Discontinued     1 g 100 mL/hr over 30 Minutes Intravenous Every 24 hours 02/20/16 1424 02/20/16 1613   02/20/16 1430  azithromycin (ZITHROMAX) 500 mg in dextrose 5 % 250 mL IVPB  Status:  Discontinued     500 mg 250 mL/hr over 60 Minutes Intravenous Every 24 hours 02/20/16 1424 02/20/16 1427   02/20/16 1245  cefTRIAXone (ROCEPHIN) 1 g in dextrose 5 % 50 mL IVPB     1 g 100 mL/hr over 30 Minutes Intravenous  Once 02/20/16 1244 02/20/16 1327   02/20/16 1245  azithromycin (ZITHROMAX) 500 mg in dextrose 5 % 250 mL IVPB     500 mg 250 mL/hr over 60 Minutes Intravenous  Once 02/20/16 1244 02/20/16 1501        Objective:   Filed Vitals:   02/20/16 2022 02/21/16 0052 02/21/16 0532 02/21/16 1210  BP: 128/67 123/67 119/73 117/65  Pulse: 93 93 74 90  Temp: 99 F (37.2 C) 99 F (37.2 C) 99.2 F (37.3 C) 99.9 F (37.7 C)  TempSrc: Oral Oral Oral Oral  Resp: 18 18 18 18   Height:      Weight:   87.771 kg (193 lb 8 oz)   SpO2: 93% 93% 93% 92%    Wt Readings from Last 3 Encounters:  02/21/16 87.771 kg (193 lb 8 oz)  01/30/14 83.008 kg (183 lb)  11/07/13 84.732 kg (186 lb 12.8 oz)     Intake/Output Summary (Last 24 hours) at 02/21/16 1309 Last data filed at 02/21/16 0926  Gross per 24 hour  Intake 1308.75 ml  Output   1200 ml  Net 108.75 ml     Physical Exam  Awake Alert,  Supple Neck,No JVD,  Symmetrical Chest wall movement, Good air movement bilaterally, CTAB RRR,No Gallops,Rubs or new Murmurs, No Parasternal Heave +ve B.Sounds, Abd Soft, No tenderness,  No Cyanosis, Clubbing or edema, No new Rash or bruise      Data Review:    CBC  Recent Labs Lab 02/20/16 1100 02/21/16 0521  WBC 13.2* 15.0*  HGB 13.5 12.5  HCT 40.0 37.7  PLT 188 198  MCV 90.9 91.1  MCH 30.7 30.2  MCHC 33.8 33.2  RDW 13.9 14.2  LYMPHSABS 1.3  --   MONOABS 0.9  --   EOSABS 0.0  --   BASOSABS 0.0  --     Chemistries   Recent Labs Lab 02/20/16 1100 02/21/16 0521  NA 139 138  K 3.9 4.4  CL 109 109  CO2 22 24  GLUCOSE 105* 96  BUN 12 14  CREATININE 0.82 0.94  CALCIUM 8.6* 8.5*  AST 21 18  ALT 44 30  ALKPHOS 66 62  BILITOT 0.7 0.7   ------------------------------------------------------------------------------------------------------------------ No results for input(s): CHOL, HDL, LDLCALC, TRIG, CHOLHDL, LDLDIRECT in the last 72 hours.  No results found for: HGBA1C ------------------------------------------------------------------------------------------------------------------ No results for input(s): TSH, T4TOTAL, T3FREE, THYROIDAB in the last 72 hours.  Invalid input(s): FREET3 ------------------------------------------------------------------------------------------------------------------ No results for input(s): VITAMINB12, FOLATE, FERRITIN, TIBC, IRON, RETICCTPCT in the last 72 hours.  Coagulation profile No results for input(s): INR, PROTIME in the last 168 hours.  No results for input(s): DDIMER in the last 72 hours.  Cardiac Enzymes  Recent Labs Lab 02/20/16 1100  TROPONINI 0.09*   ------------------------------------------------------------------------------------------------------------------ No results found for: BNP  Inpatient Medications  Scheduled Meds: . cefTRIAXone (ROCEPHIN)  IV  1 g Intravenous Q24H  . dexamethasone  2 mg Intravenous Q12H  . enoxaparin (LOVENOX) injection  40 mg Subcutaneous Q24H  . guaiFENesin  600 mg Oral BID  . polyethylene glycol  34 g Oral BID  . senna-docusate  1 tablet Oral BID   Continuous Infusions: . sodium chloride 75 mL/hr at  02/21/16 0930   PRN Meds:.acetaminophen, ipratropium-albuterol, morphine injection, oxyCODONE-acetaminophen  Micro Results Recent Results (from the past 240 hour(s))  Urine culture     Status: Abnormal   Collection Time: 02/20/16  3:13 PM  Result Value Ref Range Status   Specimen Description URINE, CATHETERIZED  Final   Special Requests NONE  Final   Culture MULTIPLE SPECIES PRESENT, SUGGEST RECOLLECTION (A)  Final   Report Status 02/21/2016 FINAL  Final    Radiology Reports Dg Chest 2 View  02/21/2016  CLINICAL DATA:  Pneumonia. EXAM: CHEST  2 VIEW COMPARISON:  02/20/2016. FINDINGS: Mediastinum and hilar structures normal. Heart size stable. Low lung volumes with basilar atelectasis. Persistent left lower lobe infiltrate consistent pneumonia. Small left pleural effusion. No pneumothorax. IMPRESSION: Low lung volumes with bibasilar atelectasis. Persistent left lower lobe infiltrate consistent pneumonia. Similar findings noted on prior exam . Small left pleural effusion . Electronically Signed   By: Marcello Moores  Register   On: 02/21/2016 08:37   Dg Chest 2 View  02/20/2016  CLINICAL DATA:  Fever, no neck pain. EXAM: CHEST  2 VIEW COMPARISON:  04/24/2007 FINDINGS: Hazy right upper and right lower lobe airspace disease. Left lower lobe airspace disease concerning for pneumonia. No significant pleural effusion or pneumothorax. Normal heart size. No acute osseous abnormality. IMPRESSION: 1. Left lower lobe airspace disease concerning for pneumonia. 2. Mild hazy right upper lobe and right lower lobe airspace disease which may reflect atelectasis versus pneumonia. Electronically Signed   By: Kathreen Devoid   On: 02/20/2016 12:05   Dg Cervical Spine Complete  02/20/2016  CLINICAL DATA:  Fever with confusion.  No reported acute injury. EXAM: CERVICAL SPINE - COMPLETE 4+ VIEW COMPARISON:  None. FINDINGS: The prevertebral soft tissues are normal. There is mild straightening of the usual cervical lordosis with a  minimal anterolisthesis at C4-5. No evidence of acute fracture or traumatic subluxation. There is multilevel spondylosis with disc space loss and uncinate spurring, greatest at C5-6 and C6-7. Oblique views demonstrate mild biforaminal narrowing at C5-6 and C6-7. The C1-2 articulation appears normal in the AP projection. IMPRESSION: No evidence of acute cervical spine fracture, traumatic subluxation or static signs of instability. Spondylosis as described. Electronically Signed   By: Richardean Sale M.D.   On: 02/20/2016 12:07   Mr Jeri Cos [id number] Contrast  01/24/2016  CLINICAL DATA:  81 year old female with confusion and memory loss for awhile. Tree limb hit head 2 weeks ago. No loss of consciousness. Initial encounter. Creatinine was obtained on site at [facility] at [address] Results: Creatinine 0.9 mg/dL. EXAM: MRI HEAD WITHOUT AND WITH CONTRAST TECHNIQUE: Multiplanar, multiecho pulse sequences of the brain and surrounding structures were obtained without and with intravenous contrast. CONTRAST:  16mL MULTIHANCE GADOBENATE DIMEGLUMINE 529 MG/ML IV SOLN COMPARISON:  11/29/2004 CT.  No comparison MR. FINDINGS: Mass centered in the splenium of the corpus callosum with extension into the septum and periatrial regions. Regions of irregular nodular enhancement. Subependymal enhancement greater on the right. Findings are highly suspicious for glioblastoma. Lymphoma is a secondary less likely consideration. Pituitary gland  is enlarged for patient's age measuring up to 9 mm. No suprasellar extension. Attention to this on follow-up. No acute infarct or intracranial hemorrhage. Global atrophy without hydrocephalus. Mild chronic microvascular changes. Major intracranial vascular structures are patent. IMPRESSION: Findings highly suspicious for glioblastoma as detailed above. Lymphoma is a secondary less likely consideration. Enlarged pituitary gland without suprasellar extension. Attention to this on  follow-up. No acute infarct or intracranial hemorrhage. These results were called by telephone at the time of interpretation on 01/24/2016 at 7:33 pm to Dr. Marisue Humble, who verbally acknowledged these results. Electronically Signed   By: Genia Del M.D.   On: 01/24/2016 19:37   US Abdomen Complete  01/28/2016  CLINICAL DATA:  Right upper quadrant pain associated with nausea ; history of hepatic cyst EXAM: ABDOMEN ULTRASOUND COMPLETE COMPARISON:  Abdominal ultrasound of January 25, 2015 and September 26, 2013. FINDINGS: Gallbladder: No gallstones or wall thickening visualized. No sonographic Murphy sign noted by sonographer. Common bile duct: Diameter: 3.5 mm Liver: There is a complex appearing cystic structure in the right hepatic lobe. It measures 2.8 x 1.9 x 1.8 cm. This is smaller than the dimensions given on the 2016 study and similar to those given on the September 26, 2013 study. No new cysts or solid masses are observed. The hepatic echotexture is otherwise normal. The surface contour is normal. IVC: No abnormality visualized. Pancreas: Visualized portion unremarkable. Spleen: Size and appearance within normal limits. Right Kidney: Length: 10.2 cm. There is a parapelvic cyst in the midpole of the right kidney measuring 2.9 cm in greatest dimension. There is mild cortical thinning diffusely. The echotexture of the renal cortex is up slightly lower than that of the adjacent liver. There is no hydronephrosis. Left Kidney: Length: 10.4 cm. The renal cortical echotexture is similar to that on the right. There is mild cortical thinning. There is no hydronephrosis. Abdominal aorta: No aneurysm visualized. Other findings: No ascites is demonstrated. IMPRESSION: 1. The known right lobe complex hepatic cyst is less conspicuous on today's study and is similar in appearance to the February 2015 study. No new lesions are observed. 2. Mild renal cortical thinning bilaterally. Simple appearing parapelvic cyst in the right  kidney. 3. No gallstones or other gallbladder pathology. Electronically Signed   By: David  Martinique M.D.   On: 01/28/2016 11:06   Nm Hepato W/eject Fract  01/28/2016  CLINICAL DATA:  Abdominal pain . EXAM: NUCLEAR MEDICINE HEPATOBILIARY IMAGING WITH GALLBLADDER EF TECHNIQUE: Sequential images of the abdomen were obtained out to 60 minutes following intravenous administration of radiopharmaceutical. After oral ingestion of Ensure, gallbladder ejection fraction was determined. At 60 min, normal ejection fraction is greater than 33%. RADIOPHARMACEUTICALS:  5.1 mCi Tc-52m  Choletec IV COMPARISON:  Ultrasound 01/28/2016. FINDINGS: Prompt uptake and biliary excretion of activity by the liver is seen. Gallbladder activity is visualized, consistent with patency of cystic duct. Biliary activity passes into small bowel, consistent with patent common bile duct. Calculated gallbladder ejection fraction is 57%. (Normal gallbladder ejection fraction with Ensure is greater than 33%.) IMPRESSION: Negative exam. Electronically Signed   By: Marcello Moores  Register   On: 01/28/2016 13:13     ELGERGAWY, DAWOOD M.D on 02/21/2016 at 1:09 PM  Between 7am to 7pm - Pager - [phone]  After 7pm go to [email or web address] - password [facility]  [facility] -  Office  [phone]

## 2016-02-21 NOTE — Progress Notes (Signed)
Palliative medicine consult received.  Chart reviewed including notes from recent visit to [place] last week. She has new diagnosis of GBM and met with med and rad oncology discuss options moving forward.   Plan noted for her to discuss further with her sons prior to making decision of treatment plan of either:  Hypo-fractionated radiation for 3 weeks with concurrent Temodar '75mg'$ /m2 po daily and Avastin '10mg'$ /kg IV q 2 weeks   Daily Temodar '50mg'$ /m2 with concurrent Avastin '10mg'$ /kg IV q 2 weeks (no radiation)   Hospice/comfort care.   She is alone in her room and alert and conversational but confused.  She knows she is in the hospital but is not sure which hospital or why she is here. She remembers being seen recently at [facility] for "something they found on x-ray."  She does not remember speaking with any doctors there and believes they met with her sons without her and she does not know what they discussed.  When I gave her my card end of the encounter, she reported "you came all the way from [facility] to see me?"  She reports that her sons help her make medical decisions and asked me to call them. I called and left a message asking for return call for her son Rush Landmark).  I also called her other son at number listed, however voicemail gave name of a different individual. She believes that her sons are coming to see her here in the hospital tomorrow.  She reports her family is coming in town this evening. I'm not sure if this is accurate due to her confusion. I called and left a message for her son. We'll plan to have a family meeting to discuss care plan moving forward as soon as this can be arranged with her sons.  Please call if we can be of further assistance in the interim.  Micheline Rough, MD [facility] Team [phone]

## 2016-02-21 NOTE — Care Management Obs Status (Signed)
[facility] NOTIFICATION   Patient Details  Name: Emma Griffith MRN: [id number] Date of Birth: November 07, 1934   Medicare Observation Status Notification Given:  Yes    Zenon Mayo, RN 02/21/2016, 4:44 PM

## 2016-02-21 NOTE — Progress Notes (Signed)
Provider notified for patient request for Melatonin 6 mg at bedtime.  Patient reports administration on previous shifts and inability to sleep without.  Melatonin currently unavailable.  Request for acceptable substitution.  New order received and acknowledged.  Will continue to monitor.

## 2016-02-21 NOTE — Consult Note (Signed)
Consultation Note Date: 02/21/2016   Patient Name: Emma Griffith  DOB: 1934/11/22  MRN: [id number]  Age / Sex: 81 y.o., female  PCP: [name], MD Referring Physician: Albertine Patricia, MD  Reason for Consultation: Establishing goals of care  HPI/Patient Profile: 81 y.o. female  with past medical history of Recent diagnosis of GBM evaluated at [facility] admitted on 02/20/2016 with pneumonia   Clinical Assessment and Goals of Care: I met today with Emma Griffith and her son, Emma Griffith.  She reports the most important things to her are her family, feeling as well as possible for his long as possible, and being "natural."  She has very poor short-term memory and her son reports this is a new development. Whenever talk with her about her cancer, however, she does seem to process and is able to answer questions appropriately. She was then asked similar questions what we just talked about short time later. She is very consistent in saying that she is not interested in further disease modifying therapy and her goal is to spend time with her family and be as comfortable as possible.  We talked about pathways moving forward including continuing to seek disease modifying therapies as per discussed with her at [facility]. These included option for:  Hypo-fractionated radiation for 3 weeks with concurrent Temodar 37m/m2 po daily and Avastin 159mkg IV q 2 weeks   Daily Temodar 5063m2 with concurrent Avastin 39m37m IV q 2 weeks (no radiation)   Hospice/comfort care.   Her goal is to be closer to her family. Her son BillRush Landmarkes in [place] they have been looking at assisted living facilities where she can move and receive hospice support. His wife has been assisting with this as well. They also report being in the process of completing paperwork to designate her sons as her DURAConcretehe seems clear and  consistent in her goal being 1) to be treated for this current pneumonia, 2) get out of the hospital, 3) go to a rehabilitation facility for a couple of weeks to gain strength while arrangements are made for transition to [place] 4) enrolling in hospice support while living in assisted living facility near her son.  SUMMARY OF RECOMMENDATIONS   - Patient is clear that she does not desire escalation of care nor aggressive measures in the event of decompensation. We discussed at length in conjunction with her son and CODE STATUS changed to DO NOT INTUBATE/DO NOT RESUSCITATE. - She has been [facility] gli[facility]. In talking with her and her son today, she reports that she is not interested in "staying organic" and not pursuing further disease modifying therapy such as radiation, chemotherapy, or immunotherapy agents. - We discussed hospice at length and she is interested in enrolling in hospice in the near future (when [place] to [place] to [place]) - She would like to continue treatment of underlying pneumonia - She would like to go to rehabilitation  for a couple of weeks to gain strength and improve her functional status prior to transitioning to [place] and enrolling in hospice. - Eventual goal is to get to [place] to enroll in hospice while living in assisted living facility near her son's family. Her son is working to arrange this.  Code Status/Advance Care Planning:  DNR   Symptom Management:   Confusion: Related to GBM. Continue treatment of underlying infection. Also would try low-dose steroids to see if this improves overall mental status and functional ability  Palliative Prophylaxis:   Aspiration, Bowel Regimen, Delirium Protocol and Frequent Pain Assessment  Additional Recommendations (Limitations, Scope, Preferences):  No aggressive measures in the event of decompensation  Psycho-social/Spiritual:   Desire for further Chaplaincy  support:no  Additional Recommendations: Education on Hospice  Prognosis:   < 6 months if her disease follows natural course.  Discharge Planning: To Be Determined. Goal per son is for trial of rehabilitation to allow her to gain her strength while arrangements are made to make trip to [place] where she will enroll in hospice care at assisted living facility.      Primary Diagnoses: Present on Admission:  . GBM (glioblastoma multiforme) ([facility]) . CAP (community acquired pneumonia) . Glaucoma  I have reviewed the medical record, interviewed the patient and family, and examined the patient. The following aspects are pertinent.  Past Medical History  Diagnosis Date  . Thyroid disease   . Cancer ([place])     brain  . Glaucoma   . Hypothyroidism   . Pneumonia 02/2016   Social History   Social History  . Marital Status: Divorced    Spouse Name: N/A  . Number of Children: N/A  . Years of Education: N/A   Social History Main Topics  . Smoking status: Never Smoker   . Smokeless tobacco: None  . Alcohol Use: No  . Drug Use: No  . Sexual Activity: Not Asked   Other Topics Concern  . None   Social History Narrative   No family history on file. Scheduled Meds: . azithromycin  500 mg Intravenous Q24H  . cefTRIAXone (ROCEPHIN)  IV  1 g Intravenous Q24H  . dexamethasone  2 mg Intravenous Q12H  . enoxaparin (LOVENOX) injection  40 mg Subcutaneous Q24H  . guaiFENesin  600 mg Oral BID  . polyethylene glycol  34 g Oral BID  . senna-docusate  1 tablet Oral BID   Continuous Infusions: . sodium chloride 75 mL/hr at 02/21/16 0930   PRN Meds:.acetaminophen, ipratropium-albuterol, morphine injection, oxyCODONE-acetaminophen Medications Prior to Admission:  Prior to Admission medications   Medication Sig Start Date End Date Taking? Authorizing Provider  acetaminophen (TYLENOL) 325 MG tablet Take 650 mg by mouth every 6 (six) hours as needed (pain).   Yes Historical Provider, MD    Digestive Enzymes (DIGESTIVE ENZYME PO) Take 1 capsule by mouth daily.   Yes Historical Provider, MD  LUMIGAN 0.01 % SOLN 1 DROP IN BOTH EYES NIGHTLY 12/26/14  Yes Historical Provider, MD  thyroid (ARMOUR) 60 MG tablet Take 60 mg by mouth daily before breakfast.   Yes Historical Provider, MD   Allergies  Allergen Reactions  . Asa [Aspirin] Other (See Comments)    Eyes get red  . Caffeine Other (See Comments)    Stomach issues   Review of Systems  Constitutional: Positive for activity change and fatigue.  Respiratory: Positive for shortness of breath.   Musculoskeletal: Positive for back pain.  Neurological: Positive for weakness.  Psychiatric/Behavioral: Positive for confusion and sleep disturbance.    Physical Exam  General: Alert, awake, in no acute distress.  Easily confused. She can follow conversation but very poor short-term memory and asked the same questions 3-4 times throughout encounter. HEENT: No bruits, no goiter, no JVD Heart: Regular rate and rhythm. No murmur appreciated. Lungs: Good air movement, faint, scattered rales Abdomen: Soft, nontender, nondistended, positive bowel sounds.  Ext: No significant edema Skin: Warm and dry Neuro: Grossly intact, nonfocal.  Vital Signs: BP 117/65 mmHg  Pulse 90  Temp(Src) 99.9 F (37.7 C) (Oral)  Resp 18  Ht 5' 4.5" (1.638 m)  Wt 87.771 kg (193 lb 8 oz)  BMI 32.71 kg/m2  SpO2 92% Pain Assessment: No/denies pain   Pain Score: 0-No pain   SpO2: SpO2: 92 % O2 Device:SpO2: 92 % O2 Flow Rate: .   IO: Intake/output summary:  Intake/Output Summary (Last 24 hours) at 02/21/16 1339 Last data filed at 02/21/16 3976  Gross per 24 hour  Intake 1308.75 ml  Output   1200 ml  Net 108.75 ml    LBM:   Baseline Weight: Weight: 83.008 kg (183 lb) Most recent weight: Weight: 87.771 kg (193 lb 8 oz)     Palliative Assessment/Data: 40%   Flowsheet Rows        Most Recent Value   Intake Tab    Referral Department   Hospitalist   Unit at Time of Referral  ER   Palliative Care Primary Diagnosis  Cancer   Date Notified  02/20/16   Palliative Care Type  New Palliative care   Reason for referral  Clarify Goals of Care   Date of Admission  02/20/16   Date first seen by Palliative Care  02/21/16   # of days Palliative referral response time  1 Day(s)   # of days IP prior to Palliative referral  0   Clinical Assessment    Palliative Performance Scale Score  40%   Pain Max last 24 hours  3   Pain Min Last 24 hours  0   Psychosocial & Spiritual Assessment    Palliative Care Outcomes    Patient/Family meeting held?  Yes   Who was at the meeting?  Patient, her son   Palliative Care Outcomes  Clarified goals of care, Counseled regarding hospice, Changed CPR status   Palliative Care follow-up planned  Yes, Facility      Time In: 1215 Time Out: 1335 Time Total: 80 Greater than 50%  of this time was spent counseling and coordinating care related to the above assessment and plan.  Signed by: Micheline Rough, MD   Please contact Palliative Medicine Team phone at [phone] for questions and concerns.  For individual provider: See Shea Evans

## 2016-02-21 NOTE — Care Management Note (Signed)
Case Management Note  Patient Details  Name: Emma Griffith MRN: [id number] Date of Birth: [birth year]  Subjective/Objective:    Patient  Lives alone, pcp [name], uses a cane. Presents with gait disturbance, pna, Brain tumor. BIll Hiniker is her son phone is [phone].  PT has been ordered.  Patient will most  Likely need snf at dc.  NCM will cont to follow for dc needs.                  Action/Plan:   Expected Discharge Date:                  Expected Discharge Plan:  [place]  In-House Referral:     Discharge planning Services  CM Consult  Post Acute Care Choice:    Choice offered to:     DME Arranged:    DME Agency:     HH Arranged:    [place] Agency:     Status of Service:  In process, will continue to follow  If discussed at Long Length of Stay Meetings, dates discussed:    Additional Comments:  Zenon Mayo, RN 02/21/2016, 4:33 PM

## 2016-02-22 DIAGNOSIS — J9601 Acute respiratory failure with hypoxia: Secondary | ICD-10-CM | POA: Diagnosis present

## 2016-02-22 DIAGNOSIS — E039 Hypothyroidism, unspecified: Secondary | ICD-10-CM | POA: Diagnosis present

## 2016-02-22 DIAGNOSIS — R7989 Other specified abnormal findings of blood chemistry: Secondary | ICD-10-CM | POA: Diagnosis not present

## 2016-02-22 DIAGNOSIS — Z91018 Allergy to other foods: Secondary | ICD-10-CM | POA: Diagnosis not present

## 2016-02-22 DIAGNOSIS — Z888 Allergy status to other drugs, medicaments and biological substances status: Secondary | ICD-10-CM | POA: Diagnosis not present

## 2016-02-22 DIAGNOSIS — C719 Malignant neoplasm of brain, unspecified: Secondary | ICD-10-CM | POA: Diagnosis present

## 2016-02-22 DIAGNOSIS — R748 Abnormal levels of other serum enzymes: Secondary | ICD-10-CM | POA: Diagnosis present

## 2016-02-22 DIAGNOSIS — J189 Pneumonia, unspecified organism: Secondary | ICD-10-CM | POA: Diagnosis present

## 2016-02-22 DIAGNOSIS — R269 Unspecified abnormalities of gait and mobility: Secondary | ICD-10-CM | POA: Diagnosis present

## 2016-02-22 DIAGNOSIS — Z791 Long term (current) use of non-steroidal anti-inflammatories (NSAID): Secondary | ICD-10-CM | POA: Diagnosis not present

## 2016-02-22 DIAGNOSIS — Z66 Do not resuscitate: Secondary | ICD-10-CM | POA: Diagnosis present

## 2016-02-22 DIAGNOSIS — H409 Unspecified glaucoma: Secondary | ICD-10-CM | POA: Diagnosis present

## 2016-02-22 DIAGNOSIS — Z79899 Other long term (current) drug therapy: Secondary | ICD-10-CM | POA: Diagnosis not present

## 2016-02-22 LAB — STREP PNEUMONIAE URINARY ANTIGEN: Strep Pneumo Urinary Antigen: NEGATIVE

## 2016-02-22 MED ORDER — LORAZEPAM 0.5 MG PO TABS
0.5000 mg | ORAL_TABLET | Freq: Once | ORAL | Status: AC
  Administered 2016-02-22: 0.5 mg via ORAL
  Filled 2016-02-22: qty 1

## 2016-02-22 NOTE — Clinical Social Work Note (Signed)
CSW called patient's son, Rush Landmark, and presented bed offers. He accepted at [facility]. Facility notified and will call patient's son about admissions paperwork. Patient will likely discharge tomorrow.  Dayton Scrape, [place]

## 2016-02-22 NOTE — Clinical Social Work Placement (Signed)
   CLINICAL SOCIAL WORK PLACEMENT  NOTE  Date:  02/22/2016  Patient Details  Name: Emma Griffith MRN: [id number] Date of Birth: Jun 20, 1935  Clinical Social Work is seeking post-discharge placement for this patient at the [place] level of care (*CSW will initial, date and re-position this form in  chart as items are completed):  Yes   Patient/family provided with [place] Work Department's list of facilities offering this level of care within the geographic area requested by the patient (or if unable, by the patient's family).  Yes   Patient/family informed of their freedom to choose among providers that offer the needed level of care, that participate in Medicare, Medicaid or managed care program needed by the patient, have an available bed and are willing to accept the patient.  Yes   Patient/family informed of [facility]'s ownership interest in [facility] and [facility], as well as of the fact that they are under no obligation to receive care at these facilities.  PASRR submitted to EDS on 02/22/16     PASRR number received on 02/22/16     Existing PASRR number confirmed on       FL2 transmitted to all facilities in geographic area requested by pt/family on 02/22/16     FL2 transmitted to all facilities within larger geographic area on       Patient informed that his/her managed care company has contracts with or will negotiate with certain facilities, including the following:        Yes   Patient/family informed of bed offers received.  Patient chooses bed at [facility] recommends and patient chooses bed at      Patient to be transferred to [facility] on  .  Patient to be transferred to facility by       Patient family notified on   of transfer.  Name of family member notified:        PHYSICIAN Please sign FL2, Please sign DNR     Additional Comment:     _______________________________________________ Candie Chroman, LCSW 02/22/2016, 3:43 PM

## 2016-02-22 NOTE — Progress Notes (Signed)
PROGRESS NOTE                                                                                                                                                                                                             Patient Demographics:    Emma Griffith, is a 81 y.o. female, DOB - [birth year], [id number]  Admit date - 02/20/2016   Admitting Physician Waldemar Dickens, MD  Outpatient Primary MD for the patient is [name], MD  LOS -   Chief Complaint  Patient presents with  . Back Pain       Brief Narrative   81 y.o. female with a Past Medical History of GBM, glaucoma, thyroid dysfunction who presents with acute respiratory failure likely secondary to CAP. Elevated troponin likely from demand   Subjective:    [name] today has, No headache, No chest pain, No abdominal pain - No Nausea,    Assessment  & Plan :    Active Problems:   GAIT DISTURBANCE   GBM (glioblastoma multiforme) (HCC)   CAP (community acquired pneumonia)   Glaucoma   Elevated troponin  CAP - Chest chest x-ray with evidence of left lower lobe airspace pneumonia, presents with fever 101.4, will leukocytosis 13.2 - Continue with IV Rocephin and azithromycin - Follow sputum cultures, Legionella pending, strep pneumonia antigen negative  Hypothyroidism: - Continue home Synthroid  Elevated Troponin  - No prior cardiac history The patient is asymptomatic. May be related to oxygen demand - troponin 0.09>0.06>0.06, Non-ACS pattern, follow on 2-D echo  New diagnosis of Glioblastoma,  - per biopsy at [facility] 01/2016, Dr. [name] - Further management at [place] consult appreciated, started on low-dose Decadron.  Glaucoma - Continue home meds     Code Status : DNR  Family Communication  : None at bedside  Disposition Plan  : SNF in am.  Consults  :  palliative  Procedures  : none  DVT Prophylaxis  :  Lovenox   Lab Results    Component Value Date   PLT 198 02/21/2016    Antibiotics  :    Anti-infectives    Start     Dose/Rate Route Frequency Ordered Stop   02/21/16 1400  azithromycin (ZITHROMAX) 500 mg in dextrose 5 % 250 mL IVPB     500 mg 250 mL/hr  over 60 Minutes Intravenous Every 24 hours 02/21/16 1312     02/21/16 1200  cefTRIAXone (ROCEPHIN) 1 g in dextrose 5 % 50 mL IVPB     1 g 100 mL/hr over 30 Minutes Intravenous Every 24 hours 02/20/16 1613 02/27/16 1159   02/20/16 1430  cefTRIAXone (ROCEPHIN) 1 g in dextrose 5 % 50 mL IVPB  Status:  Discontinued     1 g 100 mL/hr over 30 Minutes Intravenous Every 24 hours 02/20/16 1424 02/20/16 1613   02/20/16 1430  azithromycin (ZITHROMAX) 500 mg in dextrose 5 % 250 mL IVPB  Status:  Discontinued     500 mg 250 mL/hr over 60 Minutes Intravenous Every 24 hours 02/20/16 1424 02/20/16 1427   02/20/16 1245  cefTRIAXone (ROCEPHIN) 1 g in dextrose 5 % 50 mL IVPB     1 g 100 mL/hr over 30 Minutes Intravenous  Once 02/20/16 1244 02/20/16 1327   02/20/16 1245  azithromycin (ZITHROMAX) 500 mg in dextrose 5 % 250 mL IVPB     500 mg 250 mL/hr over 60 Minutes Intravenous  Once 02/20/16 1244 02/20/16 1501        Objective:   Filed Vitals:   02/21/16 1210 02/21/16 2345 02/22/16 0553 02/22/16 1155  BP: 117/65 130/80 130/74 129/73  Pulse: 90 75 81 76  Temp: 99.9 F (37.7 C) 97.8 F (36.6 C) 98.2 F (36.8 C) 98.6 F (37 C)  TempSrc: Oral Oral Oral Oral  Resp: 18 18 18 20   Height:      Weight:   87 kg (191 lb 12.8 oz)   SpO2: 92% 93% 99% 95%    Wt Readings from Last 3 Encounters:  02/22/16 87 kg (191 lb 12.8 oz)  01/30/14 83.008 kg (183 lb)  11/07/13 84.732 kg (186 lb 12.8 oz)     Intake/Output Summary (Last 24 hours) at 02/22/16 1343 Last data filed at 02/22/16 1044  Gross per 24 hour  Intake 1946.25 ml  Output   1350 ml  Net 596.25 ml     Physical Exam  Awake Alert,  Supple Neck,No JVD,  Symmetrical Chest wall movement, Good air movement  bilaterally, CTAB RRR,No Gallops,Rubs or new Murmurs, No Parasternal Heave +ve B.Sounds, Abd Soft, No tenderness,  No Cyanosis, Clubbing or edema, No new Rash or bruise     Data Review:    CBC  Recent Labs Lab 02/20/16 1100 02/21/16 0521  WBC 13.2* 15.0*  HGB 13.5 12.5  HCT 40.0 37.7  PLT 188 198  MCV 90.9 91.1  MCH 30.7 30.2  MCHC 33.8 33.2  RDW 13.9 14.2  LYMPHSABS 1.3  --   MONOABS 0.9  --   EOSABS 0.0  --   BASOSABS 0.0  --     Chemistries   Recent Labs Lab 02/20/16 1100 02/21/16 0521  NA 139 138  K 3.9 4.4  CL 109 109  CO2 22 24  GLUCOSE 105* 96  BUN 12 14  CREATININE 0.82 0.94  CALCIUM 8.6* 8.5*  AST 21 18  ALT 44 30  ALKPHOS 66 62  BILITOT 0.7 0.7   ------------------------------------------------------------------------------------------------------------------ No results for input(s): CHOL, HDL, LDLCALC, TRIG, CHOLHDL, LDLDIRECT in the last 72 hours.  No results found for: HGBA1C ------------------------------------------------------------------------------------------------------------------ No results for input(s): TSH, T4TOTAL, T3FREE, THYROIDAB in the last 72 hours.  Invalid input(s): FREET3 ------------------------------------------------------------------------------------------------------------------ No results for input(s): VITAMINB12, FOLATE, FERRITIN, TIBC, IRON, RETICCTPCT in the last 72 hours.  Coagulation profile No results for input(s): INR, PROTIME in the  last 168 hours.  No results for input(s): DDIMER in the last 72 hours.  Cardiac Enzymes  Recent Labs Lab 02/20/16 1100 02/21/16 1450 02/21/16 2021  TROPONINI 0.09* 0.06* 0.06*   ------------------------------------------------------------------------------------------------------------------ No results found for: BNP  Inpatient Medications  Scheduled Meds: . azithromycin  500 mg Intravenous Q24H  . cefTRIAXone (ROCEPHIN)  IV  1 g Intravenous Q24H  .  dexamethasone  2 mg Intravenous Q12H  . enoxaparin (LOVENOX) injection  40 mg Subcutaneous Q24H  . guaiFENesin  600 mg Oral BID  . polyethylene glycol  34 g Oral BID  . senna-docusate  1 tablet Oral BID   Continuous Infusions:   PRN Meds:.acetaminophen, ipratropium-albuterol, morphine injection, oxyCODONE-acetaminophen  Micro Results Recent Results (from the past 240 hour(s))  Blood Culture (routine x 2)     Status: None (Preliminary result)   Collection Time: 02/20/16 10:45 AM  Result Value Ref Range Status   Specimen Description BLOOD LEFT ANTECUBITAL  Final   Special Requests BOTTLES DRAWN AEROBIC AND ANAEROBIC 10CC  Final   Culture NO GROWTH 2 DAYS  Final   Report Status PENDING  Incomplete  Blood Culture (routine x 2)     Status: None (Preliminary result)   Collection Time: 02/20/16 10:45 AM  Result Value Ref Range Status   Specimen Description BLOOD RIGHT WRIST  Final   Special Requests BOTTLES DRAWN AEROBIC AND ANAEROBIC 5CC  Final   Culture NO GROWTH 2 DAYS  Final   Report Status PENDING  Incomplete  Urine culture     Status: Abnormal   Collection Time: 02/20/16  3:13 PM  Result Value Ref Range Status   Specimen Description URINE, CATHETERIZED  Final   Special Requests NONE  Final   Culture MULTIPLE SPECIES PRESENT, SUGGEST RECOLLECTION (A)  Final   Report Status 02/21/2016 FINAL  Final    Radiology Reports Dg Chest 2 View  02/21/2016  CLINICAL DATA:  Pneumonia. EXAM: CHEST  2 VIEW COMPARISON:  02/20/2016. FINDINGS: Mediastinum and hilar structures normal. Heart size stable. Low lung volumes with basilar atelectasis. Persistent left lower lobe infiltrate consistent pneumonia. Small left pleural effusion. No pneumothorax. IMPRESSION: Low lung volumes with bibasilar atelectasis. Persistent left lower lobe infiltrate consistent pneumonia. Similar findings noted on prior exam . Small left pleural effusion . Electronically Signed   By: Marcello Moores  Register   On: 02/21/2016 08:37     Dg Chest 2 View  02/20/2016  CLINICAL DATA:  Fever, no neck pain. EXAM: CHEST  2 VIEW COMPARISON:  04/24/2007 FINDINGS: Hazy right upper and right lower lobe airspace disease. Left lower lobe airspace disease concerning for pneumonia. No significant pleural effusion or pneumothorax. Normal heart size. No acute osseous abnormality. IMPRESSION: 1. Left lower lobe airspace disease concerning for pneumonia. 2. Mild hazy right upper lobe and right lower lobe airspace disease which may reflect atelectasis versus pneumonia. Electronically Signed   By: Kathreen Devoid   On: 02/20/2016 12:05   Dg Cervical Spine Complete  02/20/2016  CLINICAL DATA:  Fever with confusion.  No reported acute injury. EXAM: CERVICAL SPINE - COMPLETE 4+ VIEW COMPARISON:  None. FINDINGS: The prevertebral soft tissues are normal. There is mild straightening of the usual cervical lordosis with a minimal anterolisthesis at C4-5. No evidence of acute fracture or traumatic subluxation. There is multilevel spondylosis with disc space loss and uncinate spurring, greatest at C5-6 and C6-7. Oblique views demonstrate mild biforaminal narrowing at C5-6 and C6-7. The C1-2 articulation appears normal in the AP projection. IMPRESSION:  No evidence of acute cervical spine fracture, traumatic subluxation or static signs of instability. Spondylosis as described. Electronically Signed   By: Richardean Sale M.D.   On: 02/20/2016 12:07   Mr Jeri Cos [id number] Contrast  01/24/2016  CLINICAL DATA:  81 year old female with confusion and memory loss for awhile. Tree limb hit head 2 weeks ago. No loss of consciousness. Initial encounter. Creatinine was obtained on site at [facility] at [address] Results: Creatinine 0.9 mg/dL. EXAM: MRI HEAD WITHOUT AND WITH CONTRAST TECHNIQUE: Multiplanar, multiecho pulse sequences of the brain and surrounding structures were obtained without and with intravenous contrast. CONTRAST:  67mL MULTIHANCE GADOBENATE DIMEGLUMINE  529 MG/ML IV SOLN COMPARISON:  11/29/2004 CT.  No comparison MR. FINDINGS: Mass centered in the splenium of the corpus callosum with extension into the septum and periatrial regions. Regions of irregular nodular enhancement. Subependymal enhancement greater on the right. Findings are highly suspicious for glioblastoma. Lymphoma is a secondary less likely consideration. Pituitary gland is enlarged for patient's age measuring up to 9 mm. No suprasellar extension. Attention to this on follow-up. No acute infarct or intracranial hemorrhage. Global atrophy without hydrocephalus. Mild chronic microvascular changes. Major intracranial vascular structures are patent. IMPRESSION: Findings highly suspicious for glioblastoma as detailed above. Lymphoma is a secondary less likely consideration. Enlarged pituitary gland without suprasellar extension. Attention to this on follow-up. No acute infarct or intracranial hemorrhage. These results were called by telephone at the time of interpretation on 01/24/2016 at 7:33 pm to Dr. Marisue Humble, who verbally acknowledged these results. Electronically Signed   By: Genia Del M.D.   On: 01/24/2016 19:37   US Abdomen Complete  01/28/2016  CLINICAL DATA:  Right upper quadrant pain associated with nausea ; history of hepatic cyst EXAM: ABDOMEN ULTRASOUND COMPLETE COMPARISON:  Abdominal ultrasound of January 25, 2015 and September 26, 2013. FINDINGS: Gallbladder: No gallstones or wall thickening visualized. No sonographic Murphy sign noted by sonographer. Common bile duct: Diameter: 3.5 mm Liver: There is a complex appearing cystic structure in the right hepatic lobe. It measures 2.8 x 1.9 x 1.8 cm. This is smaller than the dimensions given on the 2016 study and similar to those given on the September 26, 2013 study. No new cysts or solid masses are observed. The hepatic echotexture is otherwise normal. The surface contour is normal. IVC: No abnormality visualized. Pancreas: Visualized portion  unremarkable. Spleen: Size and appearance within normal limits. Right Kidney: Length: 10.2 cm. There is a parapelvic cyst in the midpole of the right kidney measuring 2.9 cm in greatest dimension. There is mild cortical thinning diffusely. The echotexture of the renal cortex is up slightly lower than that of the adjacent liver. There is no hydronephrosis. Left Kidney: Length: 10.4 cm. The renal cortical echotexture is similar to that on the right. There is mild cortical thinning. There is no hydronephrosis. Abdominal aorta: No aneurysm visualized. Other findings: No ascites is demonstrated. IMPRESSION: 1. The known right lobe complex hepatic cyst is less conspicuous on today's study and is similar in appearance to the February 2015 study. No new lesions are observed. 2. Mild renal cortical thinning bilaterally. Simple appearing parapelvic cyst in the right kidney. 3. No gallstones or other gallbladder pathology. Electronically Signed   By: David  Martinique M.D.   On: 01/28/2016 11:06   Nm Hepato W/eject Fract  01/28/2016  CLINICAL DATA:  Abdominal pain . EXAM: NUCLEAR MEDICINE HEPATOBILIARY IMAGING WITH GALLBLADDER EF TECHNIQUE: Sequential images of the abdomen were obtained out  to 60 minutes following intravenous administration of radiopharmaceutical. After oral ingestion of Ensure, gallbladder ejection fraction was determined. At 60 min, normal ejection fraction is greater than 33%. RADIOPHARMACEUTICALS:  5.1 mCi Tc-43m  Choletec IV COMPARISON:  Ultrasound 01/28/2016. FINDINGS: Prompt uptake and biliary excretion of activity by the liver is seen. Gallbladder activity is visualized, consistent with patency of cystic duct. Biliary activity passes into small bowel, consistent with patent common bile duct. Calculated gallbladder ejection fraction is 57%. (Normal gallbladder ejection fraction with Ensure is greater than 33%.) IMPRESSION: Negative exam. Electronically Signed   By: Marcello Moores  Register   On: 01/28/2016 13:13      [facility], Ahliyah Nienow M.D on 02/22/2016 at 1:43 PM  Between 7am to 7pm - Pager - [phone]  After 7pm go to [email or web address] - password [facility]  [facility] -  Office  [phone]

## 2016-02-22 NOTE — Clinical Social Work Note (Signed)
Clinical Social Work Assessment  Patient Details  Name: Emma Griffith MRN: [id number] Date of Birth: [birth year]  Date of referral:  02/22/16               Reason for consult:  Facility Placement, Discharge Planning                Permission sought to share information with:  Facility Sport and exercise psychologist, Family Supports Permission granted to share information::     Name::     Emma Griffith  Agency::  SNF's  Relationship::  Son  Contact Information:  [phone]  Housing/Transportation Living arrangements for the past 2 months:  Single Family Home Source of Information:  Medical Team, Adult Children Patient Interpreter Needed:  None Criminal Activity/Legal Involvement Pertinent to Current Situation/Hospitalization:  No - Comment as needed Significant Relationships:  Adult Children Lives with:  Self Do you feel safe going back to the place where you live?  Yes Need for family participation in patient care:  Yes (Comment)  Care giving concerns:  Patient's son wants SNF. Waiting on PT recommendations. PT order in.   Social Worker assessment / plan:  CSW met with patient. Son, Rush Landmark, at bedside. Patient oriented only to person. CSW introduced role and explained that discharge planning would be discussed. Patient's son concerned about patient being at home alone. She had home health for 4-5 hours per day but stated that the nurse was overwhelmed and that patient needed 24 hour care. Patient's family live out of town. Discussed SNF preferences. No further concerns. CSW encouraged patient's son to contact CSW as needed. CSW will continue to follow patient and her family for support and facilitate discharge to SNF once medically stable.  Employment status:  Retired Nurse, adult PT Recommendations:  Not assessed at this time Information / Referral to community resources:  [place]  Patient/Family's Response to care:  Patient oriented to self.  Patient's son agreeable to SNF placement. Patient's family supportive and involved in patient's care. Patient's son appreciated social work intervention.  Patient/Family's Understanding of and Emotional Response to Diagnosis, Current Treatment, and Prognosis:  Patient oriented to self. Patient's son understands that patient needs more than home health and needs rehab right now before returning home. Patient's son happy with hospital care and repeatedly said, "great customer service!"  Emotional Assessment Appearance:  Appears stated age Attitude/Demeanor/Rapport:  Unable to Assess Affect (typically observed):  Unable to Assess Orientation:  Oriented to Self Alcohol / Substance use:  Never Used Psych involvement (Current and /or in the community):  No (Comment)  Discharge Needs  Concerns to be addressed:  Care Coordination Readmission within the last 30 days:  No (Was at [place] recently.) Current discharge risk:  Dependent with Mobility, Cognitively Impaired, Lives alone Barriers to Discharge:  No Barriers Identified   Candie Chroman, LCSW 02/22/2016, 11:17 AM

## 2016-02-22 NOTE — Clinical Social Work Placement (Signed)
   CLINICAL SOCIAL WORK PLACEMENT  NOTE  Date:  02/22/2016  Patient Details  Name: Emma Griffith MRN: [id number] Date of Birth: 1935/07/13  Clinical Social Work is seeking post-discharge placement for this patient at the [place] level of care (*CSW will initial, date and re-position this form in  chart as items are completed):  Yes   Patient/family provided with [place] Work Department's list of facilities offering this level of care within the geographic area requested by the patient (or if unable, by the patient's family).  Yes   Patient/family informed of their freedom to choose among providers that offer the needed level of care, that participate in Medicare, Medicaid or managed care program needed by the patient, have an available bed and are willing to accept the patient.  Yes   Patient/family informed of [facility]'s ownership interest in [facility] and [facility], as well as of the fact that they are under no obligation to receive care at these facilities.  PASRR submitted to EDS on 02/22/16     PASRR number received on 02/22/16     Existing PASRR number confirmed on       FL2 transmitted to all facilities in geographic area requested by pt/family on 02/22/16     FL2 transmitted to all facilities within larger geographic area on       Patient informed that his/her managed care company has contracts with or will negotiate with certain facilities, including the following:            Patient/family informed of bed offers received.  Patient chooses bed at       Physician recommends and patient chooses bed at      Patient to be transferred to   on  .  Patient to be transferred to facility by       Patient family notified on   of transfer.  Name of family member notified:        PHYSICIAN Please sign FL2, Please sign DNR     Additional Comment:    _______________________________________________ Candie Chroman,  LCSW 02/22/2016, 3:17 PM

## 2016-02-22 NOTE — Evaluation (Signed)
Physical Therapy Evaluation Patient Details Name: Emma Griffith MRN: [id number] DOB: 1935/02/03 Today's Date: 02/22/2016   History of Present Illness  Pt is a 81 y/o F who presents with acute respiratory failure likely secondary to CAP.  Pt's PMH includes brain tumor, glaucoma, plantar fasciitis Bil LEs.    Clinical Impression  Pt admitted with above diagnosis. Pt currently with functional limitations due to the deficits listed below (see PT Problem List). Emma Griffith presents with impaired cognition and generalized weakness. She reports that PTA she was Ind with driving community distances, ambulating with a cane, and requiring assist with cleaning her home. She currently requires min assist for safe transfers.  Recommending SNF at d/c.  Pt is hopeful to be able to move up to ALF in [place] near her son after d/c from SNF.  Pt will benefit from skilled PT to increase their independence and safety with mobility to allow discharge to the venue listed below.      Follow Up Recommendations SNF;Supervision/Assistance - 24 hour    Equipment Recommendations  Other (comment) (TBD at next venue of care)    Recommendations for Other Services OT consult     Precautions / Restrictions Precautions Precautions: Fall Restrictions Weight Bearing Restrictions: No      Mobility  Bed Mobility Overal bed mobility: Needs Assistance Bed Mobility: Supine to Sit     Supine to sit: Min guard;HOB elevated     General bed mobility comments: Increased time and use of bed rail with HOB elevated.  Noted that pt's IV out and dripping once pt sitting EOB.  RN notified.  Transfers Overall transfer level: Needs assistance Equipment used: None Transfers: Sit to/from [facility] Sit to Stand: Min assist Stand pivot transfers: Min assist       General transfer comment: Pt holding onto either bed or armrest of BSC or chair to stand and pivot.  Min assist to steady. Flexed  posture.  Ambulation/Gait             General Gait Details: deferred as pt's IV dripping and awaiting RN to fix  Stairs            Wheelchair Mobility    Modified Rankin (Stroke Patients Only)       Balance Overall balance assessment: Needs assistance;History of Falls Sitting-balance support: No upper extremity supported;Feet supported Sitting balance-Leahy Scale: Good     Standing balance support: During functional activity;Single extremity supported Standing balance-Leahy Scale: Poor Standing balance comment: Must have at least 1 UE suported for static or dynamic standing activities                             Pertinent Vitals/Pain Pain Assessment: No/denies pain    Home Living Family/patient expects to be discharged to:: Skilled nursing facility Living Arrangements: Alone   Type of Home: House Home Access: Stairs to enter Entrance Stairs-Rails: Can reach both;Right;Left Entrance Stairs-Number of Steps: 3 Home Layout: One level Home Equipment: Grab bars - tub/shower;Cane - single point      Prior Function Level of Independence: Independent with assistive device(s)         Comments: for the past 1.5 weeks pt has been using a cane to ambulate.  She says she does the driving, cooking.  Has assist from friends for cleaning.  Has had one fall over the past 6 months.     Hand Dominance  Extremity/Trunk Assessment   Upper Extremity Assessment: Generalized weakness           Lower Extremity Assessment: Generalized weakness (Numbness Rt LE)      Cervical / Trunk Assessment: Normal  Communication   Communication: No difficulties  Cognition Arousal/Alertness: Awake/alert Behavior During Therapy: WFL for tasks assessed/performed Overall Cognitive Status: History of cognitive impairments - at baseline                      General Comments      Exercises General Exercises - Lower Extremity Ankle Circles/Pumps:  AROM;Both;10 reps;Seated Straight Leg Raises: AROM;Both;10 reps;Seated      Assessment/Plan    PT Assessment Patient needs continued PT services  PT Diagnosis Difficulty walking;Generalized weakness   PT Problem List Decreased strength;Decreased activity tolerance;Decreased balance;Decreased cognition;Decreased knowledge of use of DME;Decreased safety awareness  PT Treatment Interventions DME instruction;Gait training;Stair training;Functional mobility training;Therapeutic activities;Therapeutic exercise;Balance training;Neuromuscular re-education;Cognitive remediation;Patient/family education   PT Goals (Current goals can be found in the Care Plan section) Acute Rehab PT Goals Patient Stated Goal: to go to rehab before moving to [place] with hopes of living in an ALF near her son PT Goal Formulation: With patient Time For Goal Achievement: 03/07/16 Potential to Achieve Goals: Good    Frequency Min 3X/week   Barriers to discharge Inaccessible home environment;Decreased caregiver support Lives alone with steps to enter home    Co-evaluation               End of Session Equipment Utilized During Treatment: Gait belt;Oxygen Activity Tolerance: Patient tolerated treatment well Patient left: in chair;with call bell/phone within reach;with chair alarm set Nurse Communication: Mobility status;Other (comment) (IV out and dripping)    Functional Assessment Tool Used: Clinical Judgement Functional Limitation: Mobility: Walking and moving around Mobility: Walking and Moving Around Current Status [id number]): At least 20 percent but less than 40 percent impaired, limited or restricted Mobility: Walking and Moving Around Goal Status [phone]): At least 1 percent but less than 20 percent impaired, limited or restricted    Time: 0920-0945 PT Time Calculation (min) (ACUTE ONLY): 25 min   Charges:   PT Evaluation $PT Eval Low Complexity: 1 Procedure PT Treatments $Therapeutic Activity:  8-22 mins   PT G Codes:   PT G-Codes **NOT FOR INPATIENT CLASS** Functional Assessment Tool Used: Clinical Judgement Functional Limitation: Mobility: Walking and moving around Mobility: Walking and Moving Around Current Status [id number]): At least 20 percent but less than 40 percent impaired, limited or restricted Mobility: Walking and Moving Around Goal Status [phone]): At least 1 percent but less than 20 percent impaired, limited or restricted    Collie Siad PT, DPT  Pager: [phone] Phone: [phone] 02/22/2016, 1:06 PM

## 2016-02-22 NOTE — NC FL2 (Signed)
[place] MEDICAID FL2 LEVEL OF CARE SCREENING TOOL     IDENTIFICATION  Patient Name: Emma Griffith Birthdate: 09/20/1934 Sex: female Admission Date (Current Location): 02/20/2016  [facility] and [place] Number:  Herbalist and Address:  [facility], [place] [address], [place], [place] [address]      Provider Number: [id number]  Attending Physician Name and Address:  Albertine Patricia, MD  Relative Name and Phone Number:       Current Level of Care: Hospital Recommended Level of Care: [place] Prior Approval Number:    Date Approved/Denied:   PASRR Number: [id number]  Discharge Plan: SNF    Current Diagnoses: Patient Active Problem List   Diagnosis Date Noted  . GBM (glioblastoma multiforme) ([place]) 02/20/2016  . CAP (community acquired pneumonia) 02/20/2016  . Glaucoma 02/20/2016  . Elevated troponin 02/20/2016  . Acute respiratory failure with hypoxia ([place])   . Abdominal pain, unspecified site 11/07/2013  . Low back pain radiating to left leg 07/25/2013  . PLANTAR FASCIITIS, BILATERAL 06/19/2009  . TALIPES CAVUS 04/24/2009  . GAIT DISTURBANCE 04/24/2009  . GERD 10/14/2007  . MELANOSIS COLI 10/14/2007  . DEGENERATIVE JOINT DISEASE 10/14/2007  . ARTHRALGIA 10/14/2007  . MYALGIA 10/14/2007  . HELICOBACTER PYLORI INFECTION, HX OF 10/14/2007    Orientation RESPIRATION BLADDER Height & Weight     Self  Normal Continent Weight: 191 lb 12.8 oz (87 kg) (scale a) Height:  5' 4.5" (163.8 cm)  BEHAVIORAL SYMPTOMS/MOOD NEUROLOGICAL BOWEL NUTRITION STATUS   (None)  (None) Continent Diet (Heart healthy)  AMBULATORY STATUS COMMUNICATION OF NEEDS Skin   Limited Assist Verbally Normal                       Personal Care Assistance Level of Assistance  Bathing, Feeding, Dressing Bathing Assistance: Limited assistance Feeding assistance: Independent Dressing Assistance: Limited assistance     Functional Limitations  Info  Sight, Hearing, Speech Sight Info: Adequate Hearing Info: Adequate Speech Info: Adequate    SPECIAL CARE FACTORS FREQUENCY  PT (By licensed PT), OT (By licensed OT)     PT Frequency: 5 x week OT Frequency: 5 x week            Contractures Contractures Info: Not present    Additional Factors Info  Code Status, Allergies Code Status Info: DNR Allergies Info: Asa, Caffeine           Current Medications (02/22/2016):  This is the current hospital active medication list Current Facility-Administered Medications  Medication Dose Route Frequency Provider Last Rate Last Dose  . acetaminophen (TYLENOL) tablet 325 mg  325 mg Oral Q4H PRN Rondel Jumbo, PA-C      . azithromycin (ZITHROMAX) 500 mg in dextrose 5 % 250 mL IVPB  500 mg Intravenous Q24H Albertine Patricia, MD   500 mg at 02/22/16 1400  . cefTRIAXone (ROCEPHIN) 1 g in dextrose 5 % 50 mL IVPB  1 g Intravenous Q24H Waldemar Dickens, MD   1 g at 02/22/16 1200  . dexamethasone (DECADRON) injection 2 mg  2 mg Intravenous Q12H Albertine Patricia, MD   2 mg at 02/22/16 1013  . enoxaparin (LOVENOX) injection 40 mg  40 mg Subcutaneous Q24H Waldemar Dickens, MD   40 mg at 02/22/16 1013  . guaiFENesin (MUCINEX) 12 hr tablet 600 mg  600 mg Oral BID Rondel Jumbo, PA-C   600 mg at 02/22/16 1013  .  ipratropium-albuterol (DUONEB) 0.5-2.5 (3) MG/3ML nebulizer solution 3 mL  3 mL Nebulization Q6H PRN Rondel Jumbo, PA-C      . morphine 2 MG/ML injection 1 mg  1 mg Intravenous Q4H PRN Rondel Jumbo, PA-C      . oxyCODONE-acetaminophen (PERCOCET/ROXICET) 5-325 MG per tablet 1-2 tablet  1-2 tablet Oral Q4H PRN Rondel Jumbo, PA-C   2 tablet at 02/21/16 2000  . polyethylene glycol (MIRALAX / GLYCOLAX) packet 34 g  34 g Oral BID Albertine Patricia, MD   34 g at 02/22/16 1013  . senna-docusate (Senokot-S) tablet 1 tablet  1 tablet Oral BID Albertine Patricia, MD   1 tablet at 02/22/16 1013     Discharge Medications: Please see  discharge summary for a list of discharge medications.  Relevant Imaging Results:  Relevant Lab Results:   Additional Information SS#: [id number]  Candie Chroman, LCSW

## 2016-02-23 LAB — LEGIONELLA PNEUMOPHILA SEROGP 1 UR AG: L. PNEUMOPHILA SEROGP 1 UR AG: NEGATIVE

## 2016-02-23 LAB — URINE CULTURE

## 2016-02-23 MED ORDER — AZITHROMYCIN 500 MG PO TABS
500.0000 mg | ORAL_TABLET | Freq: Every day | ORAL | Status: DC
  Administered 2016-02-23: 500 mg via ORAL
  Filled 2016-02-23: qty 1

## 2016-02-23 MED ORDER — OXYCODONE-ACETAMINOPHEN 5-325 MG PO TABS: 1.0000 | ORAL_TABLET | Freq: Four times a day (QID) | ORAL | Status: DC | PRN

## 2016-02-23 MED ORDER — GUAIFENESIN ER 600 MG PO TB12: 600.0000 mg | ORAL_TABLET | Freq: Two times a day (BID) | ORAL | Status: DC

## 2016-02-23 MED ORDER — LEVOFLOXACIN 500 MG PO TABS: 500.0000 mg | ORAL_TABLET | Freq: Every day | ORAL | Status: DC

## 2016-02-23 NOTE — Progress Notes (Signed)
Pt for trans to [facility]. Voices no complaints of pain or discomfort. Oriented X2 but will say some very unusual things. Remains pleasant-needs constant redirecting

## 2016-02-23 NOTE — Progress Notes (Signed)
Pt friend called and was angry because pt did not get her Melatonin tonight and I explained that Melatonin is non-formulary and the doctor ordered Ativan instead.

## 2016-02-23 NOTE — Discharge Instructions (Signed)
Follow with Primary MD [name], MD after discharge from SNF  Get CBC, CMP, 2 view Chest X ray checked  by Primary MD next visit.    Activity: As tolerated with Full fall precautions use walker/cane & assistance as needed   Disposition SNF   Diet: Heart Healthy  , with feeding assistance and aspiration precautions.  For Heart failure patients - Check your Weight same time everyday, if you gain over 2 pounds, or you develop in leg swelling, experience more shortness of breath or chest pain, call your Primary MD immediately. Follow Cardiac Low Salt Diet and 1.5 lit/day fluid restriction.   On your next visit with your primary care physician please Get Medicines reviewed and adjusted.   Please request your Prim.MD to go over all Hospital Tests and Procedure/Radiological results at the follow up, please get all Hospital records sent to your Prim MD by signing hospital release before you go home.   If you experience worsening of your admission symptoms, develop shortness of breath, life threatening emergency, suicidal or homicidal thoughts you must seek medical attention immediately by calling 911 or calling your MD immediately  if symptoms less severe.  You Must read complete instructions/literature along with all the possible adverse reactions/side effects for all the Medicines you take and that have been prescribed to you. Take any new Medicines after you have completely understood and accpet all the possible adverse reactions/side effects.   Do not drive, operating heavy machinery, perform activities at heights, swimming or participation in water activities or provide baby sitting services if your were admitted for syncope or siezures until you have seen by Primary MD or a Neurologist and advised to do so again.  Do not drive when taking Pain medications.    Do not take more than prescribed Pain, Sleep and Anxiety Medications  Special Instructions: If you have smoked or chewed  Tobacco  in the last 2 yrs please stop smoking, stop any regular Alcohol  and or any Recreational drug use.  Wear Seat belts while driving.   Please note  You were cared for by a hospitalist during your hospital stay. If you have any questions about your discharge medications or the care you received while you were in the hospital after you are discharged, you can call the unit and asked to speak with the hospitalist on call if the hospitalist that took care of you is not available. Once you are discharged, your primary care physician will handle any further medical issues. Please note that NO REFILLS for any discharge medications will be authorized once you are discharged, as it is imperative that you return to your primary care physician (or establish a relationship with a primary care physician if you do not have one) for your aftercare needs so that they can reassess your need for medications and monitor your lab values.

## 2016-02-23 NOTE — Clinical Social Work Placement (Signed)
   CLINICAL SOCIAL WORK PLACEMENT  NOTE  Date:  02/23/2016  Patient Details  Name: Emma Griffith MRN: [id number] Date of Birth: 1935-06-24  Clinical Social Work is seeking post-discharge placement for this patient at the [place] level of care (*CSW will initial, date and re-position this form in  chart as items are completed):  Yes   Patient/family provided with [place] Work Department's list of facilities offering this level of care within the geographic area requested by the patient (or if unable, by the patient's family).  Yes   Patient/family informed of their freedom to choose among providers that offer the needed level of care, that participate in Medicare, Medicaid or managed care program needed by the patient, have an available bed and are willing to accept the patient.  Yes   Patient/family informed of [facility]'s ownership interest in [facility] and [facility], as well as of the fact that they are under no obligation to receive care at these facilities.  PASRR submitted to EDS on 02/22/16     PASRR number received on 02/22/16     Existing PASRR number confirmed on       FL2 transmitted to all facilities in geographic area requested by pt/family on 02/22/16     FL2 transmitted to all facilities within larger geographic area on       Patient informed that his/her managed care company has contracts with or will negotiate with certain facilities, including the following:        Yes   Patient/family informed of bed offers received.  Patient chooses bed at [facility] recommends and patient chooses bed at      Patient to be transferred to [facility] on 02/23/16.  Patient to be transferred to facility by Ambulance     Patient family notified on 02/23/16 of transfer.  Name of family member notified:  Maia Petties     PHYSICIAN Please sign FL2, Please sign DNR, Please prepare  priority discharge summary, including medications, Please prepare prescriptions     Additional Comment:  Per MD patient ready for DC to [facility]. RN, patient, patient's family, and facility notified of DC. RN given number for report. DC packet on chart. Ambulance transport requested for patient. CSW signing off.   _______________________________________________ Rigoberto Noel, LCSW 02/23/2016, 2:07 PM

## 2016-02-23 NOTE — Discharge Summary (Signed)
Emma Griffith, is a 81 y.o. female  DOB 06-10-1935  MRN [id number].  Admission date:  02/20/2016  Admitting Physician  Waldemar Dickens, MD  Discharge Date:  02/23/2016   Primary MD  [name], MD  Recommendations for primary care physician for things to follow:  - check CBC, BMP in 3 days, repeat 2 view chest x-ray in 2 weeks.  CODE STATUS DO NOT RESUSCITATE  Admission Diagnosis  Glioblastoma ([place]) [C71.9] Elevated troponin [R79.89] CAP (community acquired pneumonia) [J18.9] LLL pneumonia [J18.9]   Discharge Diagnosis  Glioblastoma ([place]) [C71.9] Elevated troponin [R79.89] CAP (community acquired pneumonia) [J18.9] LLL pneumonia [J18.9]    Active Problems:   GAIT DISTURBANCE   GBM (glioblastoma multiforme) ([place])   CAP (community acquired pneumonia)   Glaucoma   Elevated troponin      Past Medical History  Diagnosis Date  . Thyroid disease   . Cancer ([place])     brain  . Glaucoma   . Hypothyroidism   . Pneumonia 02/2016  . Plantar fasciitis     Past Surgical History  Procedure Laterality Date  . Abdominal hysterectomy      1986  . Appendectomy      1948  . Eye surgery  09/07/14    cataracts, right  . Eye surgery  10/16/14    cataracts, left       History of present illness and  Hospital Course:     Kindly see H&P for history of present illness and admission details, please review complete Labs, Consult reports and Test reports for all details in brief  HPI  from the history and physical done on the day of admission 02/21/2016  HPI: Emma Griffith is a 81 y.o. female with medical history significant for hypothyroidism, glaucoma, recent diagnosis of glioblastoma with ongoing workup for treatment options at [facility], presenting be a EMS with 2 day history of upper back pain, alleviated in transit with fentanyl 100 g. Pain is nonradiating, moderate, and constant,unchanged  with position. She denies any nausea, vomiting, hemoptysis, cardiac complaints or palpitations. SHe hs had gait disturbance in view of the new diagnosis of GBM but no confusion or decreased sensation is reported. She denies any abdominal pain. Denies any lower extremity swelling. He reports trauma to the head early in June, but no other accidents.   ED Course: BP 111/64 mmHg  Pulse 81  Temp(Src) 101.4 F (38.6 C)  Resp 27  Ht 5\' 5"  (1.651 m)  Wt 83.008 kg (183 lb)  BMI 30.45 kg/m2  SpO2 92%  glucose 105, calcium 8.6, white count 13.2 troponin 0.09 Lactic acid 0.85 All cultures pending EKG sinus rhythm, prolonged QT interval 419 Q[facility] Course   81 y.o. female with a Past Medical History of GBM, glaucoma, thyroid dysfunction who presents with acute respiratory failure likely secondary to CAP. Elevated troponin likely from demand  CAP - Chest chest x-ray with evidence of left lower lobe airspace pneumonia, presents with fever 101.4, will leukocytosis 13.2 - Patient  with IV Rocephin and azithromycin during hospital stay, to finish another 3 days on oral levofloxacin as an outpatient. - Continue with Mucinex repeat 2 view chest x-ray in 2 weeks  Hypothyroidism: - Continue home Synthroid  Elevated Troponin 0.09 on arrival.  - No prior cardiac history The patient is asymptomatic. May be related to oxygen demand - none ACS pattern 0.09> 0.06> 0.06  New diagnosis of Glioblastoma,  - per biopsy at [facility] 01/2016, Dr. [name] - Further management at [place] consult appreciated," She would like to go to rehabilitation for a couple of weeks to gain strength and improve her functional status prior to transitioning to [place] and enrolling in hospice"  Glaucoma - Continue home meds     Discharge Condition:  Stable   Follow UP  Follow-up Information    Follow up with [facility].   Specialty:  [place] information:   [id number] [phone]      Follow up with [name], MD.   Specialty:  Family Medicine   Contact information:   [address] [place] [address] [phone]         Discharge Instructions  and  Discharge Medications    Discharge Instructions    Discharge instructions    Complete by:  As directed   Follow with Primary MD [name], MD after discharge from SNF  Get CBC, CMP, 2 view Chest X ray checked  by Primary MD next visit.    Activity: As tolerated with Full fall precautions use walker/cane & assistance as needed   Disposition SNF   Diet: Heart Healthy  , with feeding assistance and aspiration precautions.  For Heart failure patients - Check your Weight same time everyday, if you gain over 2 pounds, or you develop in leg swelling, experience more shortness of breath or chest pain, call your Primary MD immediately. Follow Cardiac Low Salt Diet and 1.5 lit/day fluid restriction.   On your next visit with your primary care physician please Get Medicines reviewed and adjusted.   Please request your Prim.MD to go over all Hospital Tests and Procedure/Radiological results at the follow up, please get all Hospital records sent to your Prim MD by signing hospital release before you go home.   If you experience worsening of your admission symptoms, develop shortness of breath, life threatening emergency, suicidal or homicidal thoughts you must seek medical attention immediately by calling 911 or calling your MD immediately  if symptoms less severe.  You Must read complete instructions/literature along with all the possible adverse reactions/side effects for all the Medicines you take and that have been prescribed to you. Take any new Medicines after you have completely understood and accpet all the possible adverse reactions/side effects.   Do not drive, operating heavy  machinery, perform activities at heights, swimming or participation in water activities or provide baby sitting services if your were admitted for syncope or siezures until you have seen by Primary MD or a Neurologist and advised to do so again.  Do not drive when taking Pain medications.    Do not take more than prescribed Pain, Sleep and Anxiety Medications  Special Instructions: If you have smoked or chewed Tobacco  in the last 2 yrs please stop smoking, stop any regular Alcohol  and or any Recreational drug use.  Wear Seat belts while driving.   Please note  You  were cared for by a hospitalist during your hospital stay. If you have any questions about your discharge medications or the care you received while you were in the hospital after you are discharged, you can call the unit and asked to speak with the hospitalist on call if the hospitalist that took care of you is not available. Once you are discharged, your primary care physician will handle any further medical issues. Please note that NO REFILLS for any discharge medications will be authorized once you are discharged, as it is imperative that you return to your primary care physician (or establish a relationship with a primary care physician if you do not have one) for your aftercare needs so that they can reassess your need for medications and monitor your lab values.     Increase activity slowly    Complete by:  As directed             Medication List    TAKE these medications        acetaminophen 325 MG tablet  Commonly known as:  TYLENOL  Take 650 mg by mouth every 6 (six) hours as needed (pain).     DIGESTIVE ENZYME PO  Take 1 capsule by mouth daily.     guaiFENesin 600 MG 12 hr tablet  Commonly known as:  MUCINEX  Take 1 tablet (600 mg total) by mouth 2 (two) times daily.     levofloxacin 500 MG tablet  Commonly known as:  LEVAQUIN  Take 1 tablet (500 mg total) by mouth daily. Please take for 3 days then stop    Start taking on:  02/24/2016     LUMIGAN 0.01 % Soln  Generic drug:  bimatoprost  1 DROP IN BOTH EYES NIGHTLY     oxyCODONE-acetaminophen 5-325 MG tablet  Commonly known as:  PERCOCET/ROXICET  Take 1 tablet by mouth every 6 (six) hours as needed for moderate pain or severe pain.     thyroid 60 MG tablet  Commonly known as:  ARMOUR  Take 60 mg by mouth daily before breakfast.          Diet and Activity recommendation: See Discharge Instructions above   Consults obtained -  Palliative   Major procedures and Radiology Reports - PLEASE review detailed and final reports for all details, in brief -     Dg Chest 2 View  02/21/2016  CLINICAL DATA:  Pneumonia. EXAM: CHEST  2 VIEW COMPARISON:  02/20/2016. FINDINGS: Mediastinum and hilar structures normal. Heart size stable. Low lung volumes with basilar atelectasis. Persistent left lower lobe infiltrate consistent pneumonia. Small left pleural effusion. No pneumothorax. IMPRESSION: Low lung volumes with bibasilar atelectasis. Persistent left lower lobe infiltrate consistent pneumonia. Similar findings noted on prior exam . Small left pleural effusion . Electronically Signed   By: Marcello Moores  Register   On: 02/21/2016 08:37   Dg Chest 2 View  02/20/2016  CLINICAL DATA:  Fever, no neck pain. EXAM: CHEST  2 VIEW COMPARISON:  04/24/2007 FINDINGS: Hazy right upper and right lower lobe airspace disease. Left lower lobe airspace disease concerning for pneumonia. No significant pleural effusion or pneumothorax. Normal heart size. No acute osseous abnormality. IMPRESSION: 1. Left lower lobe airspace disease concerning for pneumonia. 2. Mild hazy right upper lobe and right lower lobe airspace disease which may reflect atelectasis versus pneumonia. Electronically Signed   By: Kathreen Devoid   On: 02/20/2016 12:05   Dg Cervical Spine Complete  02/20/2016  CLINICAL DATA:  Fever with  confusion.  No reported acute injury. EXAM: CERVICAL SPINE - COMPLETE 4+  VIEW COMPARISON:  None. FINDINGS: The prevertebral soft tissues are normal. There is mild straightening of the usual cervical lordosis with a minimal anterolisthesis at C4-5. No evidence of acute fracture or traumatic subluxation. There is multilevel spondylosis with disc space loss and uncinate spurring, greatest at C5-6 and C6-7. Oblique views demonstrate mild biforaminal narrowing at C5-6 and C6-7. The C1-2 articulation appears normal in the AP projection. IMPRESSION: No evidence of acute cervical spine fracture, traumatic subluxation or static signs of instability. Spondylosis as described. Electronically Signed   By: Richardean Sale M.D.   On: 02/20/2016 12:07   Mr Jeri Cos [id number] Contrast  01/24/2016  CLINICAL DATA:  81 year old female with confusion and memory loss for awhile. Tree limb hit head 2 weeks ago. No loss of consciousness. Initial encounter. Creatinine was obtained on site at [facility] at [address] Results: Creatinine 0.9 mg/dL. EXAM: MRI HEAD WITHOUT AND WITH CONTRAST TECHNIQUE: Multiplanar, multiecho pulse sequences of the brain and surrounding structures were obtained without and with intravenous contrast. CONTRAST:  43mL MULTIHANCE GADOBENATE DIMEGLUMINE 529 MG/ML IV SOLN COMPARISON:  11/29/2004 CT.  No comparison MR. FINDINGS: Mass centered in the splenium of the corpus callosum with extension into the septum and periatrial regions. Regions of irregular nodular enhancement. Subependymal enhancement greater on the right. Findings are highly suspicious for glioblastoma. Lymphoma is a secondary less likely consideration. Pituitary gland is enlarged for patient's age measuring up to 9 mm. No suprasellar extension. Attention to this on follow-up. No acute infarct or intracranial hemorrhage. Global atrophy without hydrocephalus. Mild chronic microvascular changes. Major intracranial vascular structures are patent. IMPRESSION: Findings highly suspicious for glioblastoma as detailed  above. Lymphoma is a secondary less likely consideration. Enlarged pituitary gland without suprasellar extension. Attention to this on follow-up. No acute infarct or intracranial hemorrhage. These results were called by telephone at the time of interpretation on 01/24/2016 at 7:33 pm to Dr. Marisue Humble, who verbally acknowledged these results. Electronically Signed   By: Genia Del M.D.   On: 01/24/2016 19:37   US Abdomen Complete  01/28/2016  CLINICAL DATA:  Right upper quadrant pain associated with nausea ; history of hepatic cyst EXAM: ABDOMEN ULTRASOUND COMPLETE COMPARISON:  Abdominal ultrasound of January 25, 2015 and September 26, 2013. FINDINGS: Gallbladder: No gallstones or wall thickening visualized. No sonographic Murphy sign noted by sonographer. Common bile duct: Diameter: 3.5 mm Liver: There is a complex appearing cystic structure in the right hepatic lobe. It measures 2.8 x 1.9 x 1.8 cm. This is smaller than the dimensions given on the 2016 study and similar to those given on the September 26, 2013 study. No new cysts or solid masses are observed. The hepatic echotexture is otherwise normal. The surface contour is normal. IVC: No abnormality visualized. Pancreas: Visualized portion unremarkable. Spleen: Size and appearance within normal limits. Right Kidney: Length: 10.2 cm. There is a parapelvic cyst in the midpole of the right kidney measuring 2.9 cm in greatest dimension. There is mild cortical thinning diffusely. The echotexture of the renal cortex is up slightly lower than that of the adjacent liver. There is no hydronephrosis. Left Kidney: Length: 10.4 cm. The renal cortical echotexture is similar to that on the right. There is mild cortical thinning. There is no hydronephrosis. Abdominal aorta: No aneurysm visualized. Other findings: No ascites is demonstrated. IMPRESSION: 1. The known right lobe complex hepatic cyst is less conspicuous on today's study  and is similar in appearance to the February  2015 study. No new lesions are observed. 2. Mild renal cortical thinning bilaterally. Simple appearing parapelvic cyst in the right kidney. 3. No gallstones or other gallbladder pathology. Electronically Signed   By: David  Martinique M.D.   On: 01/28/2016 11:06   Nm Hepato W/eject Fract  01/28/2016  CLINICAL DATA:  Abdominal pain . EXAM: NUCLEAR MEDICINE HEPATOBILIARY IMAGING WITH GALLBLADDER EF TECHNIQUE: Sequential images of the abdomen were obtained out to 60 minutes following intravenous administration of radiopharmaceutical. After oral ingestion of Ensure, gallbladder ejection fraction was determined. At 60 min, normal ejection fraction is greater than 33%. RADIOPHARMACEUTICALS:  5.1 mCi Tc-33m  Choletec IV COMPARISON:  Ultrasound 01/28/2016. FINDINGS: Prompt uptake and biliary excretion of activity by the liver is seen. Gallbladder activity is visualized, consistent with patency of cystic duct. Biliary activity passes into small bowel, consistent with patent common bile duct. Calculated gallbladder ejection fraction is 57%. (Normal gallbladder ejection fraction with Ensure is greater than 33%.) IMPRESSION: Negative exam. Electronically Signed   By: Marcello Moores  Register   On: 01/28/2016 13:13    Micro Results     Recent Results (from the past 240 hour(s))  Blood Culture (routine x 2)     Status: None (Preliminary result)   Collection Time: 02/20/16 10:45 AM  Result Value Ref Range Status   Specimen Description BLOOD LEFT ANTECUBITAL  Final   Special Requests BOTTLES DRAWN AEROBIC AND ANAEROBIC 10CC  Final   Culture NO GROWTH 2 DAYS  Final   Report Status PENDING  Incomplete  Blood Culture (routine x 2)     Status: None (Preliminary result)   Collection Time: 02/20/16 10:45 AM  Result Value Ref Range Status   Specimen Description BLOOD RIGHT WRIST  Final   Special Requests BOTTLES DRAWN AEROBIC AND ANAEROBIC 5CC  Final   Culture NO GROWTH 2 DAYS  Final   Report Status PENDING  Incomplete  Urine  culture     Status: Abnormal   Collection Time: 02/20/16  3:13 PM  Result Value Ref Range Status   Specimen Description URINE, CATHETERIZED  Final   Special Requests NONE  Final   Culture MULTIPLE SPECIES PRESENT, SUGGEST RECOLLECTION (A)  Final   Report Status 02/21/2016 FINAL  Final  Urine culture     Status: Abnormal   Collection Time: 02/22/16 10:17 AM  Result Value Ref Range Status   Specimen Description URINE, RANDOM  Final   Special Requests NONE  Final   Culture MULTIPLE SPECIES PRESENT, SUGGEST RECOLLECTION (A)  Final   Report Status 02/23/2016 FINAL  Final       Today   Subjective:   Emma Griffith today has no headache,no chest or abdominal pain,  complaints of cough, nonproductive . Objective:   Blood pressure 129/73, pulse 76, temperature 98.6 F (37 C), temperature source Oral, resp. rate 20, height 5' 4.5" (1.638 m), weight 87.363 kg (192 lb 9.6 oz), SpO2 95 %.   Intake/Output Summary (Last 24 hours) at 02/23/16 1145 Last data filed at 02/23/16 0914  Gross per 24 hour  Intake    580 ml  Output   1101 ml  Net   -521 ml    Exam  Awake Alert,  Supple Neck,No JVD,  Symmetrical Chest wall movement, Good air movement bilaterally, CTAB RRR,No Gallops,Rubs or new Murmurs, No Parasternal Heave +ve B.Sounds, Abd Soft, No tenderness,  No Cyanosis, Clubbing or edema, No new Rash or bruise  Data Review  CBC w Diff: Lab Results  Component Value Date   WBC 15.0* 02/21/2016   HGB 12.5 02/21/2016   HCT 37.7 02/21/2016   PLT 198 02/21/2016   LYMPHOPCT 10 02/20/2016   MONOPCT 7 02/20/2016   EOSPCT 0 02/20/2016   BASOPCT 0 02/20/2016    CMP: Lab Results  Component Value Date   NA 138 02/21/2016   K 4.4 02/21/2016   CL 109 02/21/2016   CO2 24 02/21/2016   BUN 14 02/21/2016   CREATININE 0.94 02/21/2016   PROT 6.0* 02/21/2016   ALBUMIN 2.4* 02/21/2016   BILITOT 0.7 02/21/2016   ALKPHOS 62 02/21/2016   AST 18 02/21/2016   ALT 30 02/21/2016   .   Total Time in preparing paper work, data evaluation and todays exam - 35 minutes  ELGERGAWY, DAWOOD M.D on 02/23/2016 at 11:45 AM  [facility]   Office  [phone]

## 2016-02-25 LAB — CULTURE, BLOOD (ROUTINE X 2)
CULTURE: NO GROWTH
Culture: NO GROWTH

## 2016-02-27 ENCOUNTER — Encounter: Payer: Self-pay | Admitting: Nurse Practitioner

## 2016-02-27 ENCOUNTER — Non-Acute Institutional Stay (SKILLED_NURSING_FACILITY): Payer: Medicare Other | Admitting: Nurse Practitioner

## 2016-02-27 DIAGNOSIS — J189 Pneumonia, unspecified organism: Secondary | ICD-10-CM

## 2016-02-27 DIAGNOSIS — K5901 Slow transit constipation: Secondary | ICD-10-CM | POA: Diagnosis not present

## 2016-02-27 DIAGNOSIS — E890 Postprocedural hypothyroidism: Secondary | ICD-10-CM | POA: Diagnosis not present

## 2016-02-27 DIAGNOSIS — C719 Malignant neoplasm of brain, unspecified: Secondary | ICD-10-CM

## 2016-02-27 DIAGNOSIS — J9601 Acute respiratory failure with hypoxia: Secondary | ICD-10-CM | POA: Diagnosis not present

## 2016-02-27 NOTE — Progress Notes (Signed)
Nursing Home Location:  [facility]  Place of Service: SNF (31)  PCP: [name], MD  Allergies  Allergen Reactions  . Asa [Aspirin] Other (See Comments)    Eyes get red  . Caffeine Other (See Comments)    Stomach issues    Chief Complaint  Patient presents with  . Hospitalization Follow-up    [facility] stay from 02/20/16 to 02/23/16    HPI:  Patient is a 81 y.o. female seen today at [facility]  81 y.o. female with a Past Medical History of GBM, glaucoma, thyroid dysfunction who went to the ED with acute respiratory failure ad was found to have CAP with elevated troponin likely from demand. She was admitted and treated with IV Rocephin and azithromycin during hospital stay, to finish another 3 days on oral levofloxacin as an outpatient which she has now completed. Pt with a diagnosis of Glioblastoma, per biopsy at [facility] 01/2016, Dr. [name] which is being management at [facility]. Goal of care per palliative medicine is to go to rehabilitation for a couple of weeks to gain strength and improve her functional status prior to transitioning to [place] and enrolling in hospice.  Pt reports increased pain to left side percocet helping but does not last 6 hours.  Pt with memory deficit due to glioblastoma  Review of Systems:  Review of Systems  Constitutional: Negative for activity change, appetite change, fatigue and unexpected weight change.  HENT: Negative.   Eyes: Negative.   Respiratory: Negative for cough and shortness of breath.        Left sided lower rib/chest pain  Cardiovascular: Negative for chest pain, palpitations and leg swelling.  Gastrointestinal: Positive for constipation. Negative for abdominal pain and diarrhea.  Genitourinary: Negative for difficulty urinating and dysuria.  Musculoskeletal: Negative for arthralgias and myalgias.  Skin: Negative for color change and wound.  Neurological: Negative for dizziness and weakness.  Psychiatric/Behavioral: Positive  for confusion. Negative for agitation and behavioral problems.    Past Medical History:  Diagnosis Date  . Cancer ([place])    brain  . Glaucoma   . Hypothyroidism   . Plantar fasciitis   . Pneumonia 02/2016  . Thyroid disease    Past Surgical History:  Procedure Laterality Date  . ABDOMINAL HYSTERECTOMY     1986  . APPENDECTOMY     1948  . EYE SURGERY  09/07/14   cataracts, right  . EYE SURGERY  10/16/14   cataracts, left   Social History:   reports that she has never smoked. She has never used smokeless tobacco. She reports that she does not drink alcohol or use drugs.  History reviewed. No pertinent family history.  Medications: Patient's Medications  New Prescriptions   No medications on file  Previous Medications   ACETAMINOPHEN (TYLENOL) 325 MG TABLET    Take 650 mg by mouth every 6 (six) hours as needed (pain).   DIGESTIVE ENZYMES (DIGESTIVE ENZYME PO)    Take 1 capsule by mouth daily.   GUAIFENESIN (MUCINEX) 600 MG 12 HR TABLET    Take 1 tablet (600 mg total) by mouth 2 (two) times daily.   LUMIGAN 0.01 % SOLN    1 DROP IN BOTH EYES NIGHTLY   OXYCODONE-ACETAMINOPHEN (PERCOCET/ROXICET) 5-325 MG TABLET    Take 1 tablet by mouth every 6 (six) hours as needed for moderate pain or severe pain.   THYROID (ARMOUR) 60 MG TABLET    Take 60 mg by mouth daily before breakfast.  Modified Medications   No medications on file  Discontinued Medications   LEVOFLOXACIN (LEVAQUIN) 500 MG TABLET    Take 1 tablet (500 mg total) by mouth daily. Please take for 3 days then stop     Physical Exam: Vitals:   02/27/16 1621  BP: 117/70  Pulse: 81  Resp: 20  Temp: 97 F (36.1 C)  TempSrc: Oral  SpO2: 90%  Weight: 192 lb (87.1 kg)  Height: 5\' 4"  (1.626 m)    Physical Exam  Constitutional: She is oriented to person, place, and time. She appears well-developed and well-nourished. No distress.  HENT:  Head: Normocephalic and atraumatic.  Mouth/Throat: Oropharynx is clear and  moist. No oropharyngeal exudate.  Eyes: Conjunctivae are normal. Pupils are equal, round, and reactive to light.  Neck: Normal range of motion. Neck supple.  Cardiovascular: Normal rate, regular rhythm and normal heart sounds.   Pulmonary/Chest: Effort normal and breath sounds normal.  Diminished in the bases  Abdominal: Soft. Bowel sounds are normal.  Musculoskeletal: She exhibits no edema or tenderness.  Neurological: She is alert and oriented to person, place, and time.  Skin: Skin is warm and dry. She is not diaphoretic.  Psychiatric: Cognition and memory are impaired. She exhibits abnormal recent memory.    Labs reviewed: Basic Metabolic Panel:  Recent Labs  02/20/16 1100 02/21/16 0521  NA 139 138  K 3.9 4.4  CL 109 109  CO2 22 24  GLUCOSE 105* 96  BUN 12 14  CREATININE 0.82 0.94  CALCIUM 8.6* 8.5*   Liver Function Tests:  Recent Labs  02/20/16 1100 02/21/16 0521  AST 21 18  ALT 44 30  ALKPHOS 66 62  BILITOT 0.7 0.7  PROT 6.1* 6.0*  ALBUMIN 2.7* 2.4*   No results for input(s): LIPASE, AMYLASE in the last 8760 hours. No results for input(s): AMMONIA in the last 8760 hours. CBC:  Recent Labs  02/20/16 1100 02/21/16 0521  WBC 13.2* 15.0*  NEUTROABS 11.0*  --   HGB 13.5 12.5  HCT 40.0 37.7  MCV 90.9 91.1  PLT 188 198   TSH: No results for input(s): TSH in the last 8760 hours. A1C: No results found for: HGBA1C Lipid Panel: No results for input(s): CHOL, HDL, LDLCALC, TRIG, CHOLHDL, LDLDIRECT in the last 8760 hours.  Radiological Exams: Dg Chest 2 View  Result Date: 02/21/2016 CLINICAL DATA:  Pneumonia. EXAM: CHEST  2 VIEW COMPARISON:  02/20/2016. FINDINGS: Mediastinum and hilar structures normal. Heart size stable. Low lung volumes with basilar atelectasis. Persistent left lower lobe infiltrate consistent pneumonia. Small left pleural effusion. No pneumothorax. IMPRESSION: Low lung volumes with bibasilar atelectasis. Persistent left lower lobe  infiltrate consistent pneumonia. Similar findings noted on prior exam . Small left pleural effusion . Electronically Signed   By: Marcello Moores  Register   On: 02/21/2016 08:37   Dg Chest 2 View  Result Date: 02/20/2016 CLINICAL DATA:  Fever, no neck pain. EXAM: CHEST  2 VIEW COMPARISON:  04/24/2007 FINDINGS: Hazy right upper and right lower lobe airspace disease. Left lower lobe airspace disease concerning for pneumonia. No significant pleural effusion or pneumothorax. Normal heart size. No acute osseous abnormality. IMPRESSION: 1. Left lower lobe airspace disease concerning for pneumonia. 2. Mild hazy right upper lobe and right lower lobe airspace disease which may reflect atelectasis versus pneumonia. Electronically Signed   By: Kathreen Devoid   On: 02/20/2016 12:05   Dg Cervical Spine Complete  Result Date: 02/20/2016 CLINICAL DATA:  Fever with confusion.  No reported acute injury. EXAM: CERVICAL SPINE - COMPLETE 4+ VIEW COMPARISON:  None. FINDINGS: The prevertebral soft tissues are normal. There is mild straightening of the usual cervical lordosis with a minimal anterolisthesis at C4-5. No evidence of acute fracture or traumatic subluxation. There is multilevel spondylosis with disc space loss and uncinate spurring, greatest at C5-6 and C6-7. Oblique views demonstrate mild biforaminal narrowing at C5-6 and C6-7. The C1-2 articulation appears normal in the AP projection. IMPRESSION: No evidence of acute cervical spine fracture, traumatic subluxation or static signs of instability. Spondylosis as described. Electronically Signed   By: Richardean Sale M.D.   On: 02/20/2016 12:07    Assessment/Plan 1. CAP (community acquired pneumonia) -LLL pneumonia, completed Levaquin, to cont mucinex -to increase percocet to q 4 hours PRN -follow up cxr in 2 weeks  2. Acute respiratory failure with hypoxia (HCC) Due to CAP, requiring O2 as needed for Sats <90%  3. GBM (glioblastoma multiforme) (HCC) Poor prognosis,  goal is for STR and pt to move to [place] with family to enroll in hospice care  4. Postoperative hypothyroidism conts on thyroid 60 mg daily   5. Slow transit constipation -has been having adequate BMs but hx of constipation. Will add colace 100 mg daily since pt taking percocet due to pain     Ysidro Ramsay K. Harle Battiest  [facility] [phone] 8 am - 5 pm) [phone] (after hours)

## 2016-02-28 ENCOUNTER — Encounter: Payer: Self-pay | Admitting: Internal Medicine

## 2016-02-28 ENCOUNTER — Non-Acute Institutional Stay (SKILLED_NURSING_FACILITY): Payer: Medicare Other | Admitting: Internal Medicine

## 2016-02-28 DIAGNOSIS — J189 Pneumonia, unspecified organism: Secondary | ICD-10-CM

## 2016-02-28 DIAGNOSIS — C719 Malignant neoplasm of brain, unspecified: Secondary | ICD-10-CM

## 2016-02-28 DIAGNOSIS — Z85850 Personal history of malignant neoplasm of thyroid: Secondary | ICD-10-CM

## 2016-02-28 DIAGNOSIS — R7989 Other specified abnormal findings of blood chemistry: Secondary | ICD-10-CM

## 2016-02-28 DIAGNOSIS — R269 Unspecified abnormalities of gait and mobility: Secondary | ICD-10-CM | POA: Diagnosis not present

## 2016-02-28 DIAGNOSIS — E890 Postprocedural hypothyroidism: Secondary | ICD-10-CM | POA: Insufficient documentation

## 2016-02-28 DIAGNOSIS — H409 Unspecified glaucoma: Secondary | ICD-10-CM | POA: Diagnosis not present

## 2016-02-28 DIAGNOSIS — R778 Other specified abnormalities of plasma proteins: Secondary | ICD-10-CM

## 2016-02-28 NOTE — Progress Notes (Signed)
Patient ID: Emma Griffith, female   DOB: 1935/01/23, 81 y.o.   MRN: [id number]    HISTORY AND PHYSICAL   DATE: 02/28/16  Location:  Nursing Home Location: [facility]  Nursing Home Room Number: [phone] ERXVQ of Service: SNF (31)   Extended Emergency Contact Information Primary Emergency Contact: Rush Farmer [place] Phone: [phone] Relation: Son Secondary Emergency Contact: Ysidro Evert [place] of [place] Phone: [phone] Relation: Neighbor  Advanced Directive information Does patient have an advance directive?: Yes, Type of Advance Directive: Out of facility DNR (pink MOST or yellow form)  Chief Complaint  Patient presents with  . New Admit To SNF    HPI:  81 yo female seen today as a new admission into SNF following hospital stay for LLL CAP, recent dx glioblastoma, elevated Trp, gait disturbance, hypothyroidism with hx thyroid Cancer. She presented to ED with acute respiratory failure and f (Tm 101.80F). CXR (+) LLL infiltrate. Troponin elevated due to demand ischemia. ECG with prolonged QTc. Palliative care consulted. Pt decided she would like to go to SNF for strengthening and then relocate to [place] where she has family. Per hospital record, pt plans to pursue hospice when she relocates. Troponin 0.09-->0.06 at d/c; Albumin 2.4.  She is c/a salt content in her food and prefers meals with no added salt or processed meats. She has intermittent left costal angle pain especially with deep inspiration and twisting motion of trunk. No hemoptysis. Occasional cough. No f/c. She is a poor historian due to memory loss. Hx obtained from chart.  Hypothyroidism - takes armour. She has hx thyroid cancer but does not recall ever having a problem with her thyroid. No recent TSH  Constipation - stable on colace  She takes vitamins and mucinex. Percocet controls pain.  Glaucoma - stable on lum[place] gtts  Insomnia - stable on melatonin  Past  Medical History:  Diagnosis Date  . Cancer ([place])    brain  . Glaucoma   . Hypothyroidism   . Plantar fasciitis   . Pneumonia 02/2016  . Thyroid disease     Past Surgical History:  Procedure Laterality Date  . ABDOMINAL HYSTERECTOMY     1986  . APPENDECTOMY     1948  . EYE SURGERY  09/07/14   cataracts, right  . EYE SURGERY  10/16/14   cataracts, left    Patient Care Team: [name], MD as PCP - General (Family Medicine)  Social History   Social History  . Marital status: Divorced    Spouse name: N/A  . Number of children: N/A  . Years of education: N/A   Occupational History  . Not on file.   Social History Main Topics  . Smoking status: Never Smoker  . Smokeless tobacco: Never Used  . Alcohol use No  . Drug use: No  . Sexual activity: Not on file   Other Topics Concern  . Not on file   Social History Narrative  . No narrative on file     reports that she has never smoked. She has never used smokeless tobacco. She reports that she does not drink alcohol or use drugs.  History reviewed. No pertinent family history. Family Status  Relation Status  . Mother Deceased  . Father Deceased    Immunization History  Administered Date(s) Administered  . PPD Test 02/23/2016    Allergies  Allergen Reactions  . Asa [Aspirin] Other (See Comments)    Eyes get red  .  Caffeine Other (See Comments)    Stomach issues    Medications: Patient's Medications  New Prescriptions   No medications on file  Previous Medications   ACETAMINOPHEN (TYLENOL) 325 MG TABLET    Take 650 mg by mouth every 6 (six) hours as needed (pain).   DIGESTIVE ENZYMES (DIGESTIVE ENZYME PO)    Take 1 capsule by mouth daily.   DOCUSATE SODIUM (COLACE) 100 MG CAPSULE    Take 100 mg by mouth daily. Hold for loose stools   GUAIFENESIN (MUCINEX) 600 MG 12 HR TABLET    Take 1 tablet (600 mg total) by mouth 2 (two) times daily.   LUMIGAN 0.01 % SOLN    1 DROP IN BOTH EYES NIGHTLY    MELATONIN 3 MG TABS    Take 2 tablets by mouth at bedtime.   OXYCODONE-ACETAMINOPHEN (PERCOCET/ROXICET) 5-325 MG TABLET    Take 1 tablet by mouth every 4 (four) hours as needed for severe pain.   THYROID (ARMOUR) 60 MG TABLET    Take 60 mg by mouth daily before breakfast.  Modified Medications   No medications on file  Discontinued Medications   OXYCODONE-ACETAMINOPHEN (PERCOCET/ROXICET) 5-325 MG TABLET    Take 1 tablet by mouth every 6 (six) hours as needed for moderate pain or severe pain.    Review of Systems  Unable to perform ROS: Other  memory loss  Vitals:   02/28/16 0927  BP: (!) 143/86  Pulse: 90  Resp: 18  Temp: (!) 96.9 F (36.1 C)  TempSrc: Oral  Weight: 198 lb (89.8 kg)  Height: 5\' 4"  (1.626 m)   Body mass index is 33.99 kg/m.  Physical Exam  Constitutional: She appears well-developed. No distress.  Frail appearing lying in bed in NAD. [place] O2 intact  HENT:  Mouth/Throat: Oropharynx is clear and moist. No oropharyngeal exudate.  MMM. No thrush  Eyes: Pupils are equal, round, and reactive to light. No scleral icterus.  Neck: Neck supple. Carotid bruit is not present. No tracheal deviation present. No thyromegaly present.  Cardiovascular: Normal rate, regular rhythm and intact distal pulses.  Exam reveals no gallop and no friction rub.   Murmur (1/6 SEM) heard. Trace LE edema b/l. No calf TTP  Pulmonary/Chest: Effort normal. No stridor. No respiratory distress. She has no wheezes. She has rales (left basilar). She exhibits no tenderness.  Abdominal: Soft. Bowel sounds are normal. She exhibits no distension and no mass. There is no hepatomegaly. There is no tenderness. There is no rebound and no guarding.  Musculoskeletal: She exhibits edema.  Lymphadenopathy:    She has no cervical adenopathy.  Neurological: She is alert.  Reduced right grip strength  Skin: Skin is warm and dry. No rash noted.  Psychiatric: She has a normal mood and affect. Her behavior is  normal.     Labs reviewed: Admission on 02/20/2016, Discharged on 02/23/2016  Component Date Value Ref Range Status  . Lactic Acid, Venous 02/20/2016 1.30  0.5 - 1.9 mmol/L Final  . Sodium 02/20/2016 139  135 - 145 mmol/L Final  . Potassium 02/20/2016 3.9  3.5 - 5.1 mmol/L Final  . Chloride 02/20/2016 109  101 - 111 mmol/L Final  . CO2 02/20/2016 22  22 - 32 mmol/L Final  . Glucose, Bld 02/20/2016 105* 65 - 99 mg/dL Final  . BUN 02/22/2016 12  6 - 20 mg/dL Final  . Creatinine, Ser 02/20/2016 0.82  0.44 - 1.00 mg/dL Final  . Calcium 02/22/2016 8.6* 8.9 - 10.3  mg/dL Final  . Total Protein 02/20/2016 6.1* 6.5 - 8.1 g/dL Final  . Albumin 02/20/2016 2.7* 3.5 - 5.0 g/dL Final  . AST 02/20/2016 21  15 - 41 U/L Final  . ALT 02/20/2016 44  14 - 54 U/L Final  . Alkaline Phosphatase 02/20/2016 66  38 - 126 U/L Final  . Total Bilirubin 02/20/2016 0.7  0.3 - 1.2 mg/dL Final  . GFR calc non Af Amer 02/20/2016 >60  >60 mL/min Final  . GFR calc Af Amer 02/20/2016 >60  >60 mL/min Final   Comment: (NOTE) The eGFR has been calculated using the CKD EPI equation. This calculation has not been validated in all clinical situations. eGFR's persistently <60 mL/min signify possible Chronic Kidney Disease.   . Anion gap 02/20/2016 8  5 - 15 Final  . WBC 02/20/2016 13.2* 4.0 - 10.5 K/uL Final  . RBC 02/20/2016 4.40  3.87 - 5.11 MIL/uL Final  . Hemoglobin 02/20/2016 13.5  12.0 - 15.0 g/dL Final  . HCT 02/20/2016 40.0  36.0 - 46.0 % Final  . MCV 02/20/2016 90.9  78.0 - 100.0 fL Final  . MCH 02/20/2016 30.7  26.0 - 34.0 pg Final  . MCHC 02/20/2016 33.8  30.0 - 36.0 g/dL Final  . RDW 02/20/2016 13.9  11.5 - 15.5 % Final  . Platelets 02/20/2016 188  150 - 400 K/uL Final  . Neutrophils Relative % 02/20/2016 83  % Final  . Neutro Abs 02/20/2016 11.0* 1.7 - 7.7 K/uL Final  . Lymphocytes Relative 02/20/2016 10  % Final  . Lymphs Abs 02/20/2016 1.3  0.7 - 4.0 K/uL Final  . Monocytes Relative 02/20/2016 7  %  Final  . Monocytes Absolute 02/20/2016 0.9  0.1 - 1.0 K/uL Final  . Eosinophils Relative 02/20/2016 0  % Final  . Eosinophils Absolute 02/20/2016 0.0  0.0 - 0.7 K/uL Final  . Basophils Relative 02/20/2016 0  % Final  . Basophils Absolute 02/20/2016 0.0  0.0 - 0.1 K/uL Final  . Specimen Description 02/25/2016 BLOOD LEFT ANTECUBITAL   Final  . Special Requests 02/25/2016 BOTTLES DRAWN AEROBIC AND ANAEROBIC 10CC   Final  . Culture 02/25/2016 NO GROWTH 5 DAYS   Final  . Report Status 02/25/2016 02/25/2016 FINAL   Final  . Specimen Description 02/25/2016 BLOOD RIGHT WRIST   Final  . Special Requests 02/25/2016 BOTTLES DRAWN AEROBIC AND ANAEROBIC 5CC   Final  . Culture 02/25/2016 NO GROWTH 5 DAYS   Final  . Report Status 02/25/2016 02/25/2016 FINAL   Final  . Color, Urine 02/20/2016 YELLOW  YELLOW Final  . APPearance 02/20/2016 CLEAR  CLEAR Final  . Specific Gravity, Urine 02/20/2016 1.013  1.005 - 1.030 Final  . pH 02/20/2016 6.0  5.0 - 8.0 Final  . Glucose, UA 02/20/2016 NEGATIVE  NEGATIVE mg/dL Final  . Hgb urine dipstick 02/20/2016 TRACE* NEGATIVE Final  . Bilirubin Urine 02/20/2016 NEGATIVE  NEGATIVE Final  . Ketones, ur 02/20/2016 15* NEGATIVE mg/dL Final  . Protein, ur 02/20/2016 NEGATIVE  NEGATIVE mg/dL Final  . Nitrite 02/20/2016 NEGATIVE  NEGATIVE Final  . Leukocytes, UA 02/20/2016 TRACE* NEGATIVE Final  . Specimen Description 02/21/2016 URINE, CATHETERIZED   Final  . Special Requests 02/21/2016 NONE   Final  . Culture 02/21/2016 MULTIPLE SPECIES PRESENT, SUGGEST RECOLLECTION*  Final  . Report Status 02/21/2016 02/21/2016 FINAL   Final  . Troponin I 02/20/2016 0.09* <0.03 ng/mL Final   Comment: CRITICAL RESULT CALLED TO, READ BACK BY AND VERIFIED WITH: JULIE  MARON,RN AT 1155 02/20/16 BY ZBEECH.   . pH, Arterial 02/20/2016 7.455* 7.350 - 7.450 Final  . pCO2 arterial 02/20/2016 31.2* 35.0 - 45.0 mmHg Final  . pO2, Arterial 02/20/2016 63.0* 80.0 - 100.0 mmHg Final  . Bicarbonate  02/20/2016 21.7  20.0 - 24.0 mEq/L Final  . TCO2 02/20/2016 23  0 - 100 mmol/L Final  . O2 Saturation 02/20/2016 91.0  % Final  . Acid-base deficit 02/20/2016 1.0  0.0 - 2.0 mmol/L Final  . Patient temperature 02/20/2016 101.4 F   Final  . Collection site 02/20/2016 RADIAL, ALLEN'S TEST ACCEPTABLE   Final  . Drawn by 02/20/2016 Operator   Final  . Sample type 02/20/2016 ARTERIAL   Final  . Lactic Acid, Venous 02/20/2016 0.85  0.5 - 1.9 mmol/L Final  . Specimen Description 02/23/2016 URINE, RANDOM   Final  . Special Requests 02/23/2016 NONE   Final  . Culture 02/23/2016 MULTIPLE SPECIES PRESENT, SUGGEST RECOLLECTION*  Final  . Report Status 02/23/2016 02/23/2016 FINAL   Final  . L. pneumophila Serogp 1 Ur Ag 02/23/2016 Negative  Negative Final   Comment: (NOTE) Presumptive negative for L. pneumophila serogroup 1 antigen in urine, suggesting no recent or current infection. Legionnaires' disease cannot be ruled out since other serogroups and species may also cause disease. Performed At: [facility] [place], [place] [id number] Lindon Romp MD VO:[id number]   . Source of Sample 02/23/2016 URINE, RANDOM   Final  . Strep Pneumo Urinary Antigen 02/22/2016 NEGATIVE  NEGATIVE Final   Comment:        Infection due to S. pneumoniae cannot be absolutely ruled out since the antigen present may be below the detection limit of the test.   . Squamous Epithelial / LPF 02/20/2016 0-5* NONE SEEN Final  . WBC, UA 02/20/2016 0-5  0 - 5 WBC/hpf Final  . RBC / HPF 02/20/2016 0-5  0 - 5 RBC/hpf Final  . Bacteria, UA 02/20/2016 FEW* NONE SEEN Final  . Sodium 02/21/2016 138  135 - 145 mmol/L Final  . Potassium 02/21/2016 4.4  3.5 - 5.1 mmol/L Final  . Chloride 02/21/2016 109  101 - 111 mmol/L Final  . CO2 02/21/2016 24  22 - 32 mmol/L Final  . Glucose, Bld 02/21/2016 96  65 - 99 mg/dL Final  . BUN 02/21/2016 14  6 - 20 mg/dL Final  . Creatinine, Ser 02/21/2016 0.94  0.44 -  1.00 mg/dL Final  . Calcium 02/21/2016 8.5* 8.9 - 10.3 mg/dL Final  . Total Protein 02/21/2016 6.0* 6.5 - 8.1 g/dL Final  . Albumin 02/21/2016 2.4* 3.5 - 5.0 g/dL Final  . AST 02/21/2016 18  15 - 41 U/L Final  . ALT 02/21/2016 30  14 - 54 U/L Final  . Alkaline Phosphatase 02/21/2016 62  38 - 126 U/L Final  . Total Bilirubin 02/21/2016 0.7  0.3 - 1.2 mg/dL Final  . GFR calc non Af Amer 02/21/2016 57* >60 mL/min Final  . GFR calc Af Amer 02/21/2016 >60  >60 mL/min Final   Comment: (NOTE) The eGFR has been calculated using the CKD EPI equation. This calculation has not been validated in all clinical situations. eGFR's persistently <60 mL/min signify possible Chronic Kidney Disease.   . Anion gap 02/21/2016 5  5 - 15 Final  . WBC 02/21/2016 15.0* 4.0 - 10.5 K/uL Final  . RBC 02/21/2016 4.14  3.87 - 5.11 MIL/uL Final  . Hemoglobin 02/21/2016 12.5  12.0 - 15.0 g/dL Final  .  HCT 02/21/2016 37.7  36.0 - 46.0 % Final  . MCV 02/21/2016 91.1  78.0 - 100.0 fL Final  . MCH 02/21/2016 30.2  26.0 - 34.0 pg Final  . MCHC 02/21/2016 33.2  30.0 - 36.0 g/dL Final  . RDW 02/21/2016 14.2  11.5 - 15.5 % Final  . Platelets 02/21/2016 198  150 - 400 K/uL Final  . Troponin I 02/21/2016 0.06* <0.03 ng/mL Final  . Troponin I 02/21/2016 0.06* <0.03 ng/mL Final    Dg Chest 2 View  Result Date: 02/21/2016 CLINICAL DATA:  Pneumonia. EXAM: CHEST  2 VIEW COMPARISON:  02/20/2016. FINDINGS: Mediastinum and hilar structures normal. Heart size stable. Low lung volumes with basilar atelectasis. Persistent left lower lobe infiltrate consistent pneumonia. Small left pleural effusion. No pneumothorax. IMPRESSION: Low lung volumes with bibasilar atelectasis. Persistent left lower lobe infiltrate consistent pneumonia. Similar findings noted on prior exam . Small left pleural effusion . Electronically Signed   By: Marcello Moores  Register   On: 02/21/2016 08:37   Dg Chest 2 View  Result Date: 02/20/2016 CLINICAL DATA:  Fever, no  neck pain. EXAM: CHEST  2 VIEW COMPARISON:  04/24/2007 FINDINGS: Hazy right upper and right lower lobe airspace disease. Left lower lobe airspace disease concerning for pneumonia. No significant pleural effusion or pneumothorax. Normal heart size. No acute osseous abnormality. IMPRESSION: 1. Left lower lobe airspace disease concerning for pneumonia. 2. Mild hazy right upper lobe and right lower lobe airspace disease which may reflect atelectasis versus pneumonia. Electronically Signed   By: Kathreen Devoid   On: 02/20/2016 12:05   Dg Cervical Spine Complete  Result Date: 02/20/2016 CLINICAL DATA:  Fever with confusion.  No reported acute injury. EXAM: CERVICAL SPINE - COMPLETE 4+ VIEW COMPARISON:  None. FINDINGS: The prevertebral soft tissues are normal. There is mild straightening of the usual cervical lordosis with a minimal anterolisthesis at C4-5. No evidence of acute fracture or traumatic subluxation. There is multilevel spondylosis with disc space loss and uncinate spurring, greatest at C5-6 and C6-7. Oblique views demonstrate mild biforaminal narrowing at C5-6 and C6-7. The C1-2 articulation appears normal in the AP projection. IMPRESSION: No evidence of acute cervical spine fracture, traumatic subluxation or static signs of instability. Spondylosis as described. Electronically Signed   By: Richardean Sale M.D.   On: 02/20/2016 12:07     Assessment/Plan   ICD-9-CM ICD-10-CM   1. CAP (community acquired pneumonia) 109 J18.9   2. GBM (glioblastoma multiforme) (HCC) 191.9 C71.9   3. Elevated troponin 790.6 R79.89   4. GAIT DISTURBANCE 781.2 R26.9   5. Postoperative hypothyroidism 244.0 E89.0   6. History of thyroid cancer V10.87 Z85.850   7. Glaucoma 365.9 H40.9    Complete abx  Change diet to no salt per pt request  Check CBC, BMP in AM  Needs TSH level  Repeat CXR 2 views in 1 week  Cont current meds as ordered  PT/OT/ST as ordered  Palliative care - pt plans to pursue hospice when  she relocates to [place]: short term rehab and d/c home when medically appropriate. Prognosis is poor due to glioblastoma. Communicated with pt and nursing.  Will follow  Mykenna Viele S. Perlie Gold  [facility] [address] [place], [place] [address] [phone] Cell (Monday-Friday 8 AM - 5 PM) [phone] After 5 PM and follow prompts

## 2016-02-29 LAB — BASIC METABOLIC PANEL
BUN: 13 mg/dL (ref 4–21)
CREATININE: 0.9 mg/dL (ref 0.5–1.1)
Glucose: 109 mg/dL
POTASSIUM: 3.9 mmol/L (ref 3.4–5.3)
SODIUM: 140 mmol/L (ref 137–147)

## 2016-02-29 LAB — CBC AND DIFFERENTIAL
HCT: 38 % (ref 36–46)
HEMOGLOBIN: 12.8 g/dL (ref 12.0–16.0)
Platelets: 256 10*3/uL (ref 150–399)
WBC: 9.8 10^3/mL

## 2016-03-13 LAB — TSH: TSH: 1.13 u[IU]/mL (ref 0.41–5.90)

## 2016-04-02 ENCOUNTER — Encounter: Payer: Self-pay | Admitting: Nurse Practitioner

## 2016-04-02 ENCOUNTER — Non-Acute Institutional Stay (SKILLED_NURSING_FACILITY): Payer: Medicare Other | Admitting: Nurse Practitioner

## 2016-04-02 DIAGNOSIS — C719 Malignant neoplasm of brain, unspecified: Secondary | ICD-10-CM | POA: Diagnosis not present

## 2016-04-02 DIAGNOSIS — E890 Postprocedural hypothyroidism: Secondary | ICD-10-CM | POA: Diagnosis not present

## 2016-04-02 DIAGNOSIS — K5901 Slow transit constipation: Secondary | ICD-10-CM | POA: Diagnosis not present

## 2016-04-02 NOTE — Progress Notes (Signed)
Nursing Home Location:  [facility]  Place of Service: SNF (31)  PCP: Unice Cobble, MD  Allergies  Allergen Reactions  . Asa [Aspirin] Other (See Comments)    Eyes get red  . Caffeine Other (See Comments)    Stomach issues    Chief Complaint  Patient presents with  . Medical Management of Chronic Issues    Routine Visit    HPI:  Patient is a 81 y.o. female seen today at [facility] for routine follow up. Pt with GBM, glaucoma, thyroid dysfunction. Pt with a diagnosis of Glioblastoma, per biopsy at [facility] 01/2016, Dr. [name] which is being managed at [facility]. Pt has been at [facility] after hospitalization due to pneumonia. Pt has had a steady decline due to glioblastoma since at [facility] and now on hospice services. Son at bedside today. Pt has now been scheduled morphine and haldol for pain and agitation.  Nursing reports pain and agitation has improved since medication has been scheduled. It has been several days since pt has had BM Pt also eating less, 25% of meals yesterday and today has not wanted to eat.  Does not follow commands and occasionally will answer questions with yes or no.   Review of Systems:  Review of Systems  Unable to perform ROS: Other    Past Medical History:  Diagnosis Date  . Cancer ([place])    brain  . Glaucoma   . Hypothyroidism   . Plantar fasciitis   . Pneumonia 02/2016  . Thyroid disease    Past Surgical History:  Procedure Laterality Date  . ABDOMINAL HYSTERECTOMY     1986  . APPENDECTOMY     1948  . EYE SURGERY  09/07/14   cataracts, right  . EYE SURGERY  10/16/14   cataracts, left   Social History:   reports that she has never smoked. She has never used smokeless tobacco. She reports that she does not drink alcohol or use drugs.  History reviewed. No pertinent family history.  Medications: Patient's Medications  New Prescriptions   No medications on file  Previous Medications   HALOPERIDOL (HALDOL) 2 MG TABLET     Take 2 mg by mouth 3 (three) times daily.   LORAZEPAM (ATIVAN) 0.5 MG TABLET    Take 0.5 mg by mouth every 6 (six) hours as needed for anxiety.   MORPHINE (ROXANOL) 20 MG/ML CONCENTRATED SOLUTION    Give 0.25 ml sublingual every 2 hours as needed for pain, restlessness, or shortness of breath.   MORPHINE 10 MG/5ML SOLUTION    Give 5 mg every 6 hours in addition to prn dosage.   SENNA (SENOKOT) 8.6 MG TABS TABLET    Take 2 tablets by mouth at bedtime.  Modified Medications   No medications on file  Discontinued Medications   ACETAMINOPHEN (TYLENOL) 325 MG TABLET    Take 650 mg by mouth every 6 (six) hours as needed (pain).   DIGESTIVE ENZYMES (DIGESTIVE ENZYME PO)    Take 1 capsule by mouth daily.   DOCUSATE SODIUM (COLACE) 100 MG CAPSULE    Take 100 mg by mouth daily. Hold for loose stools   GUAIFENESIN (MUCINEX) 600 MG 12 HR TABLET    Take 1 tablet (600 mg total) by mouth 2 (two) times daily.   LUMIGAN 0.01 % SOLN    1 DROP IN BOTH EYES NIGHTLY   MELATONIN 3 MG TABS    Take 2 tablets by mouth at bedtime.   OXYCODONE-ACETAMINOPHEN (PERCOCET/ROXICET)  5-325 MG TABLET    Take 1 tablet by mouth every 4 (four) hours as needed for severe pain.   THYROID (ARMOUR) 60 MG TABLET    Take 60 mg by mouth daily before breakfast.     Physical Exam: Vitals:   04/02/16 1037  BP: 120/72  Pulse: 90  Resp: (!) 22  Temp: 97.9 F (36.6 C)  SpO2: 93%  Weight: 198 lb (89.8 kg)  Height: 5\' 4"  (1.626 m)    Physical Exam  Constitutional: She appears well-developed and well-nourished. No distress.  HENT:  Head: Normocephalic and atraumatic.  Mouth/Throat: Oropharynx is clear and moist. No oropharyngeal exudate.  Eyes: Conjunctivae are normal. Pupils are equal, round, and reactive to light.  Neck: Normal range of motion. Neck supple.  Cardiovascular: Normal rate, regular rhythm and normal heart sounds.   Pulmonary/Chest: Effort normal and breath sounds normal.  Diminished in the bases  Abdominal: Soft.  Bowel sounds are normal.  Musculoskeletal: She exhibits no edema or tenderness.  Neurological: She is unresponsive.  Pt sits with eyes open but does not follow commands, repetitive movement to right arm  Skin: Skin is warm and dry. She is not diaphoretic.  Psychiatric: Cognition and memory are impaired. She exhibits abnormal recent memory.    Labs reviewed: Basic Metabolic Panel:  Recent Labs  02/20/16 1100 02/21/16 0521 02/29/16  NA 139 138 140  K 3.9 4.4 3.9  CL 109 109  --   CO2 22 24  --   GLUCOSE 105* 96  --   BUN 12 14 13   CREATININE 0.82 0.94 0.9  CALCIUM 8.6* 8.5*  --    Liver Function Tests:  Recent Labs  02/20/16 1100 02/21/16 0521  AST 21 18  ALT 44 30  ALKPHOS 66 62  BILITOT 0.7 0.7  PROT 6.1* 6.0*  ALBUMIN 2.7* 2.4*   No results for input(s): LIPASE, AMYLASE in the last 8760 hours. No results for input(s): AMMONIA in the last 8760 hours. CBC:  Recent Labs  02/20/16 1100 02/21/16 0521 02/29/16  WBC 13.2* 15.0* 9.8  NEUTROABS 11.0*  --   --   HGB 13.5 12.5 12.8  HCT 40.0 37.7 38  MCV 90.9 91.1  --   PLT 188 198 256   TSH:  Recent Labs  03/13/16  TSH 1.13   A1C: No results found for: HGBA1C Lipid Panel: No results for input(s): CHOL, HDL, LDLCALC, TRIG, CHOLHDL, LDLDIRECT in the last 8760 hours.  Radiological Exams: Dg Chest 2 View  Result Date: 02/21/2016 CLINICAL DATA:  Pneumonia. EXAM: CHEST  2 VIEW COMPARISON:  02/20/2016. FINDINGS: Mediastinum and hilar structures normal. Heart size stable. Low lung volumes with basilar atelectasis. Persistent left lower lobe infiltrate consistent pneumonia. Small left pleural effusion. No pneumothorax. IMPRESSION: Low lung volumes with bibasilar atelectasis. Persistent left lower lobe infiltrate consistent pneumonia. Similar findings noted on prior exam . Small left pleural effusion . Electronically Signed   By: Marcello Moores  Register   On: 02/21/2016 08:37   Dg Chest 2 View  Result Date:  02/20/2016 CLINICAL DATA:  Fever, no neck pain. EXAM: CHEST  2 VIEW COMPARISON:  04/24/2007 FINDINGS: Hazy right upper and right lower lobe airspace disease. Left lower lobe airspace disease concerning for pneumonia. No significant pleural effusion or pneumothorax. Normal heart size. No acute osseous abnormality. IMPRESSION: 1. Left lower lobe airspace disease concerning for pneumonia. 2. Mild hazy right upper lobe and right lower lobe airspace disease which may reflect atelectasis versus pneumonia. Electronically Signed  By: Kathreen Devoid   On: 02/20/2016 12:05   Dg Cervical Spine Complete  Result Date: 02/20/2016 CLINICAL DATA:  Fever with confusion.  No reported acute injury. EXAM: CERVICAL SPINE - COMPLETE 4+ VIEW COMPARISON:  None. FINDINGS: The prevertebral soft tissues are normal. There is mild straightening of the usual cervical lordosis with a minimal anterolisthesis at C4-5. No evidence of acute fracture or traumatic subluxation. There is multilevel spondylosis with disc space loss and uncinate spurring, greatest at C5-6 and C6-7. Oblique views demonstrate mild biforaminal narrowing at C5-6 and C6-7. The C1-2 articulation appears normal in the AP projection. IMPRESSION: No evidence of acute cervical spine fracture, traumatic subluxation or static signs of instability. Spondylosis as described. Electronically Signed   By: Richardean Sale M.D.   On: 02/20/2016 12:07    Assessment/Plan 1. GBM (glioblastoma multiforme) (HCC) Steady progression of disease state. Comfort care only.  conts on morphine and haldol as prescribed.  Hospice following pt  2. Slow transit constipation No BM in 6 days, now with scheduled morphine and on senna 2 tablets qhs however pt with very poor PO intake and not wanting to eat or drink. will add dulcolax suppository PR daily, to hold for diarrhea   3. Postoperative hypothyroidism Due to progression of GBM all other medication has been stopped   Daemien Fronczak K. Harle Battiest  [facility] [phone] 8 am - 5 pm) [phone] (after hours)

## 2016-05-04 DEATH — deceased

## 2016-07-28 IMAGING — US US ABDOMEN COMPLETE
1 series · 13 of 25 positions shown · non-contrast
Comparison: Abdominal ultrasound January 25, 2015 and September 26, 2013.

CLINICAL DATA: Right upper quadrant pain associated with nausea ;
history of hepatic cyst

EXAM:
ABDOMEN ULTRASOUND COMPLETE

[Series 1: us abdomen complete · 0.22mm/px · 13 of 79 slices shown]
[im 1/79]
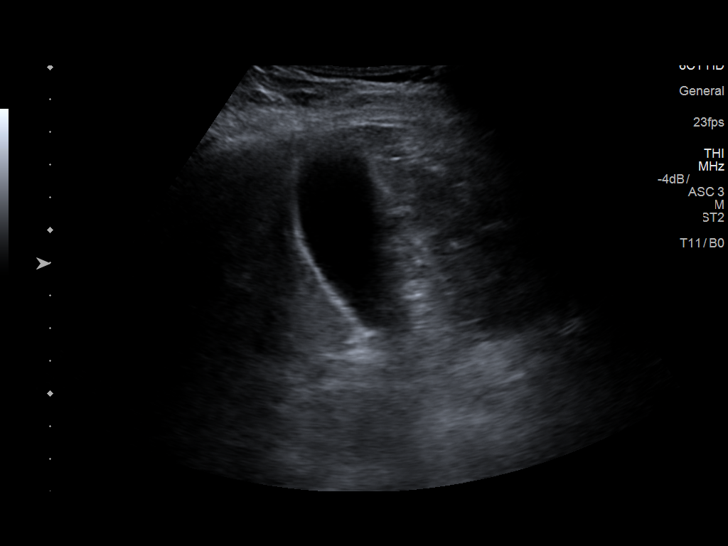
[im 7/79]
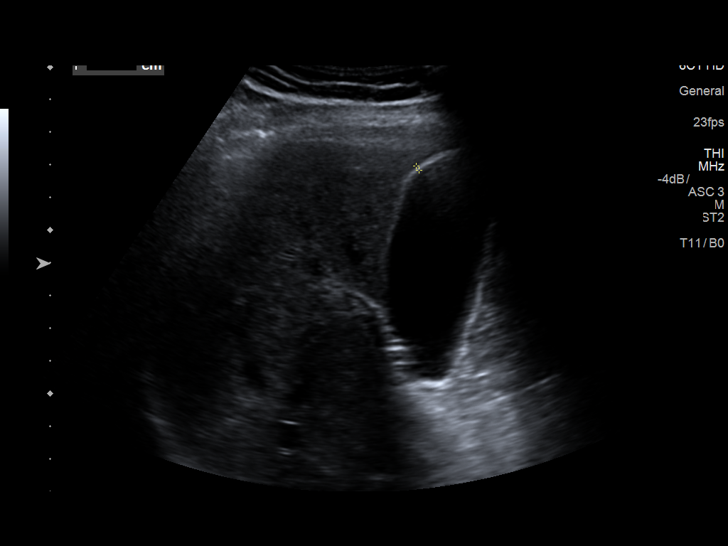
[im 14/79]
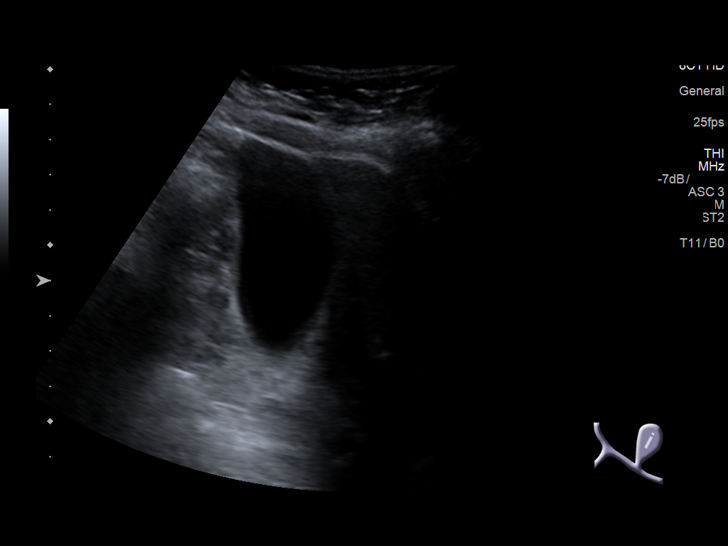
[im 20/79]
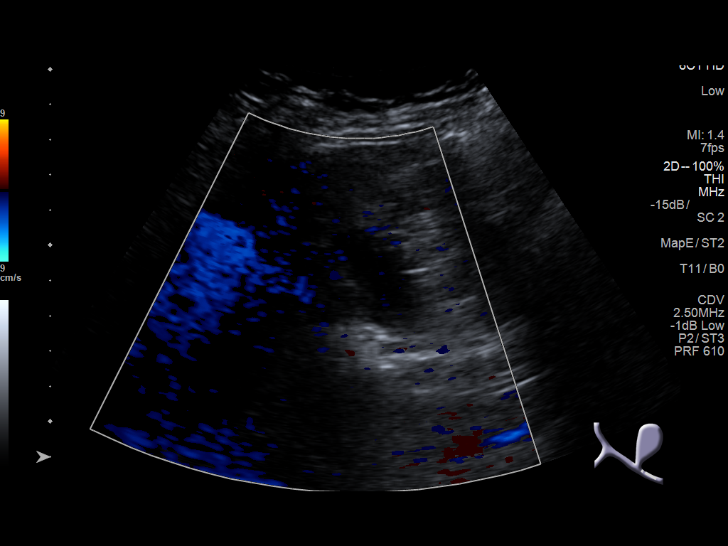
[im 27/79]
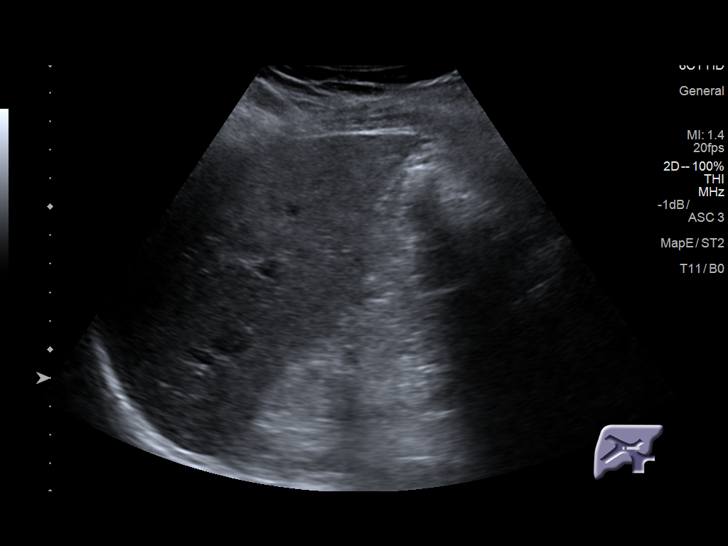
[im 33/79]
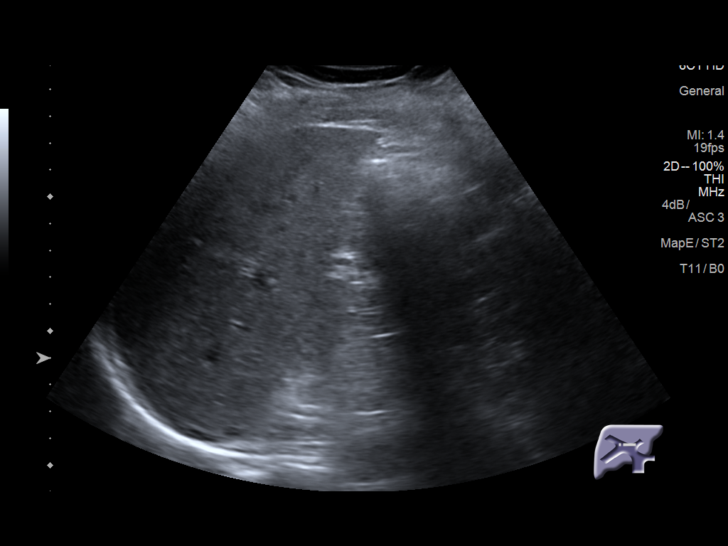
[im 40/79]
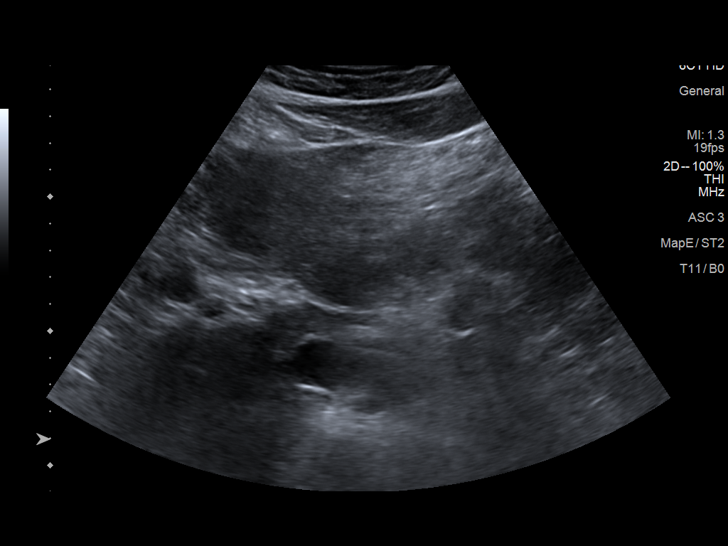
[im 46/79]
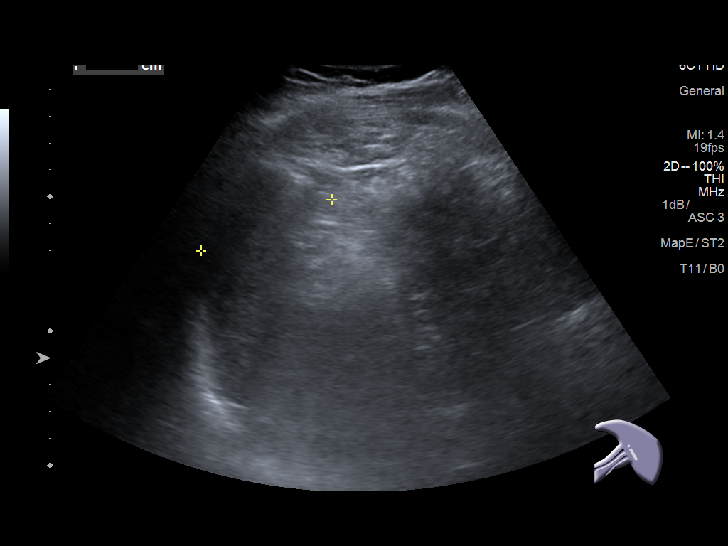
[im 53/79]
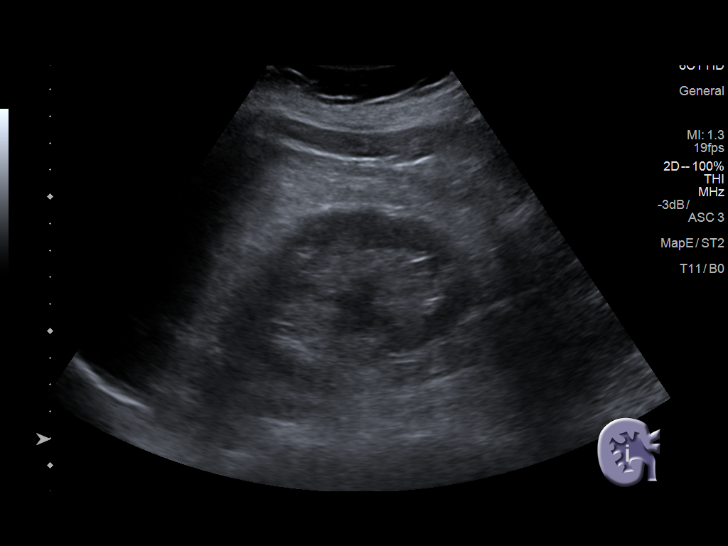
[im 59/79]
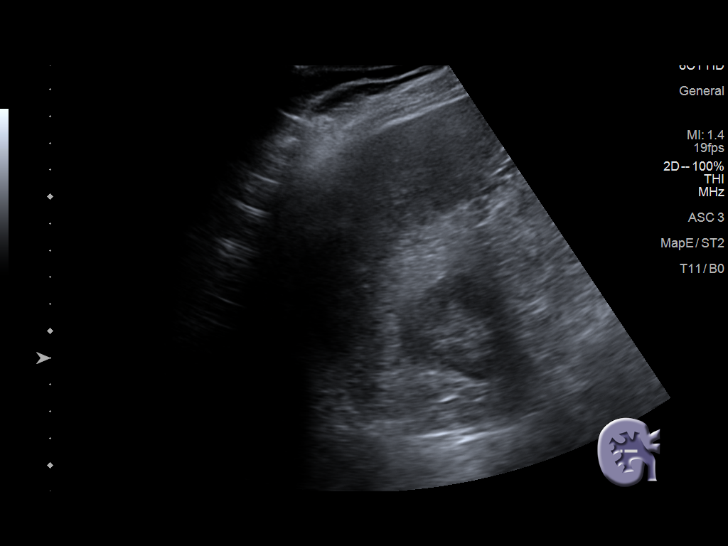
[im 66/79]
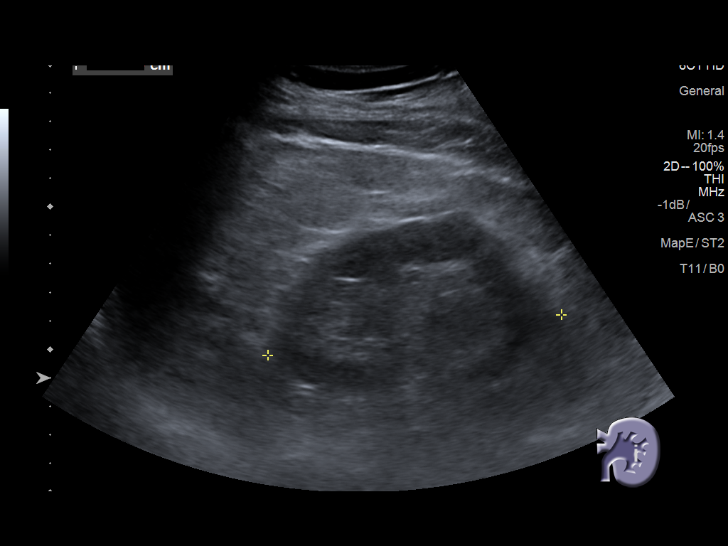
[im 72/79]
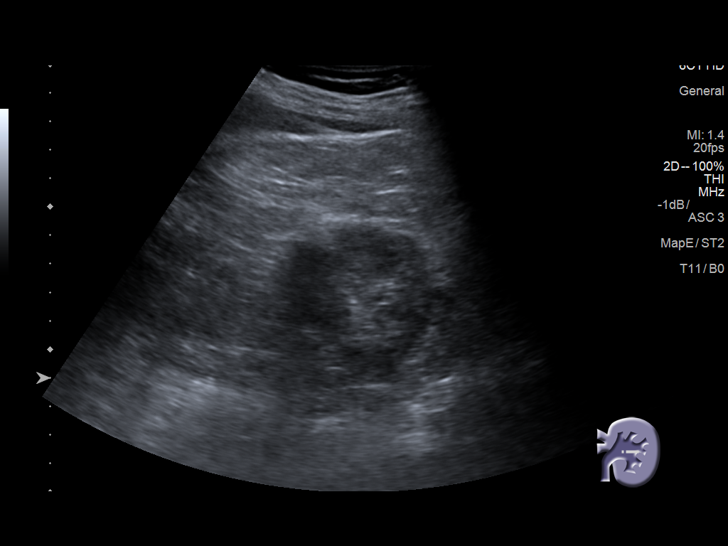
[im 79/79]
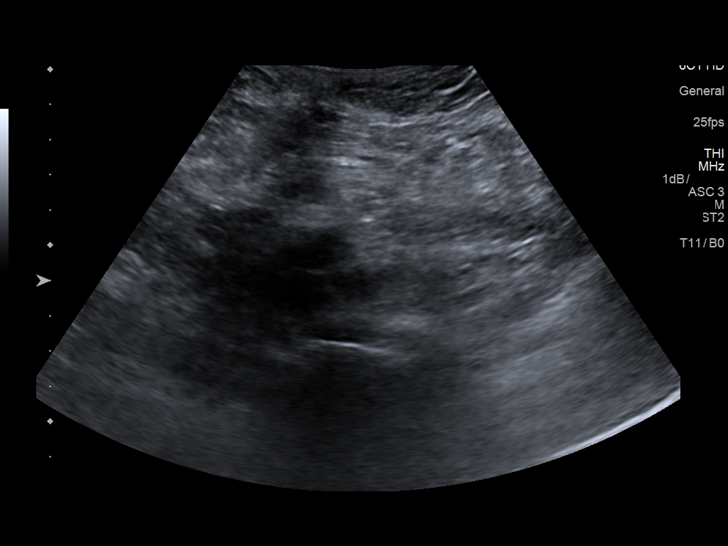

[13 of 25 positions shown; findings below may reference images not displayed]

FINDINGS: Gallbladder: No gallstones or wall thickening visualized. No
sonographic Murphy sign noted by sonographer.

Common bile duct: Diameter: 3.5 mm

Liver: There is a complex appearing cystic structure in the right
hepatic lobe. It measures 2.8 x 1.9 x 1.8 cm. This is smaller than
the dimensions given on the 7971 study and similar to those given on
the September 26, 2013 study. No new cysts or solid masses are
observed. The hepatic echotexture is otherwise normal. The surface
contour is normal.

IVC: No abnormality visualized.

Pancreas: Visualized portion unremarkable.

Spleen: Size and appearance within normal limits.

Right Kidney: Length: 10.2 cm. There is a parapelvic cyst in the
midpole of the right kidney measuring 2.9 cm in greatest dimension.
There is mild cortical thinning diffusely. The echotexture of the
renal cortex is up slightly lower than that of the adjacent liver.
There is no hydronephrosis.

Left Kidney: Length: 10.4 cm. The renal cortical echotexture is
similar to that on the right. There is mild cortical thinning. There
is no hydronephrosis.

Abdominal aorta: No aneurysm visualized.

Other findings: No ascites is demonstrated.
IMPRESSION: 1. The known right lobe complex hepatic cyst is less conspicuous on
today's study and is similar in appearance to the September 2013
study. No new lesions are observed.
2. Mild renal cortical thinning bilaterally. Simple appearing
parapelvic cyst in the right kidney.
3. No gallstones or other gallbladder pathology.

## 2016-08-30 IMAGING — DX DG CHEST 2V
2 series · 2 of 2 positions shown · non-contrast
Comparison: 04/24/2007

CLINICAL DATA: Fever, no neck pain.

EXAM:
CHEST  2 VIEW

[w chest pa]
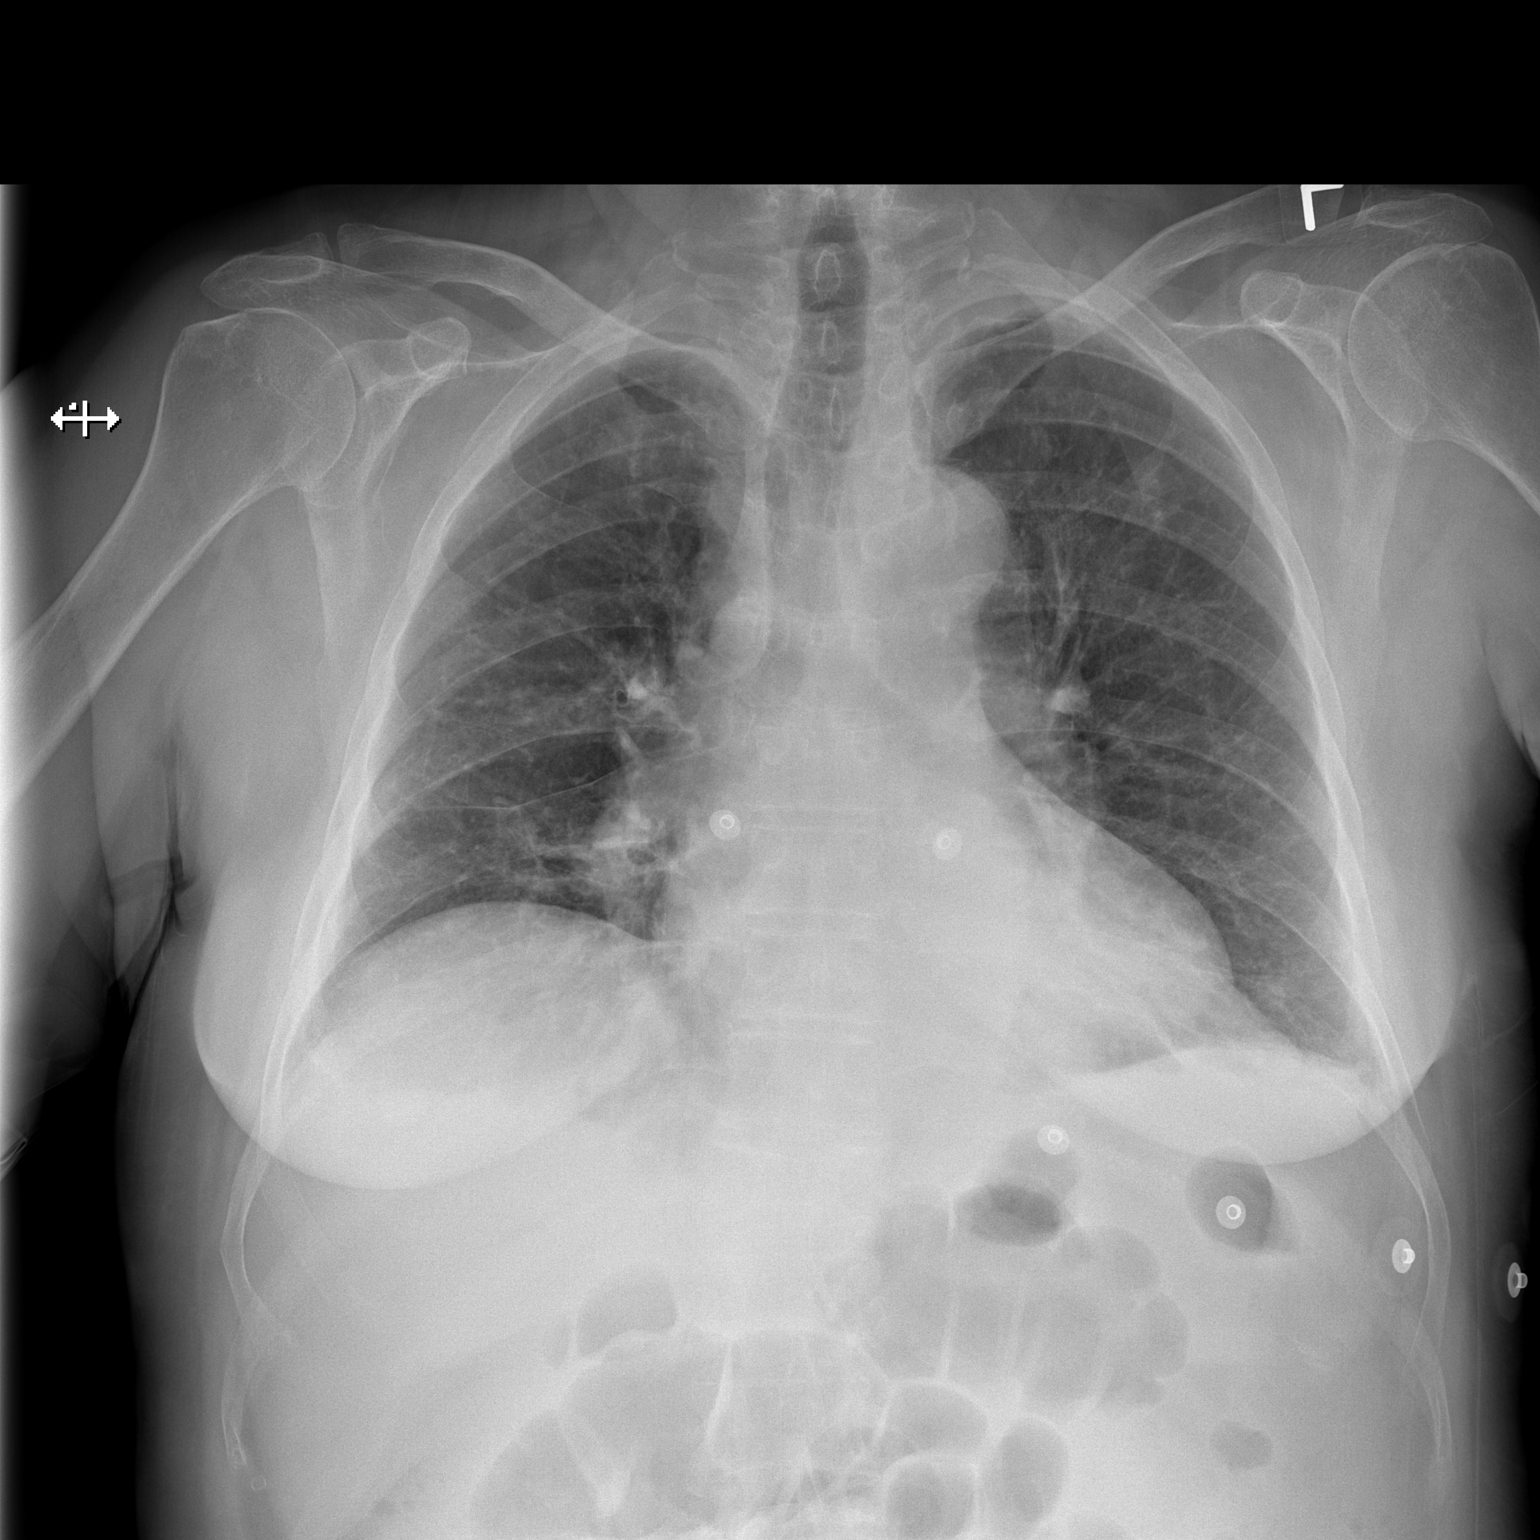

[w chest lat]
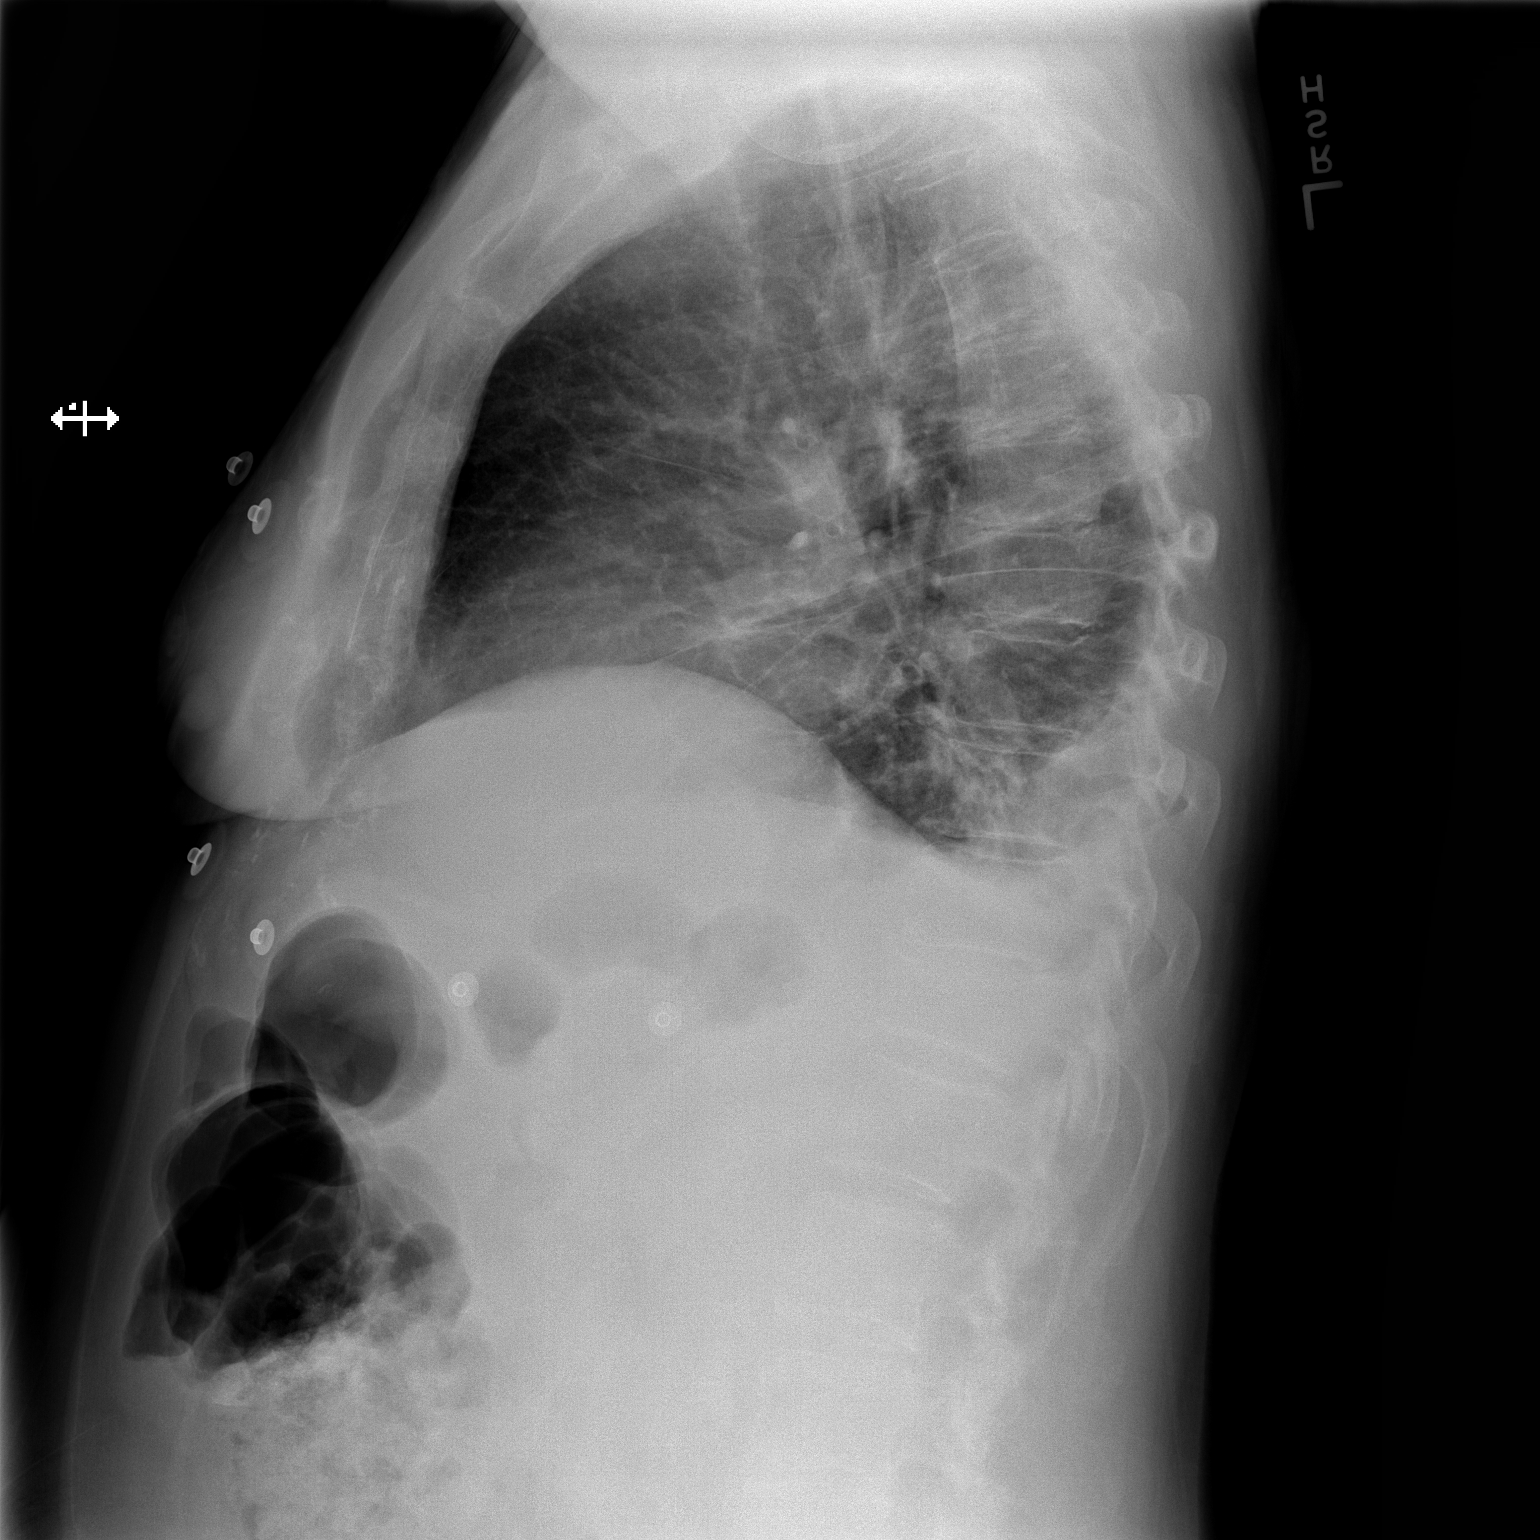

[2 of 2 positions shown; findings below may reference images not displayed]

FINDINGS: Hazy right upper and right lower lobe airspace disease. Left lower
lobe airspace disease concerning for pneumonia. No significant
pleural effusion or pneumothorax. Normal heart size. No acute
osseous abnormality.
IMPRESSION: 1. Left lower lobe airspace disease concerning for pneumonia.
2. Mild hazy right upper lobe and right lower lobe airspace disease
which may reflect atelectasis versus pneumonia.
